# Patient Record
Sex: Female | Born: 1975 | Race: White | Hispanic: No | Marital: Single | Smoking: Never smoker
Health system: Southern US, Community
[De-identification: ages and names within clinical notes are randomized; demographics above are authoritative.]

## PROBLEM LIST (undated history)

## (undated) DIAGNOSIS — G709 Myoneural disorder, unspecified: Secondary | ICD-10-CM

## (undated) DIAGNOSIS — E079 Disorder of thyroid, unspecified: Secondary | ICD-10-CM

## (undated) DIAGNOSIS — K589 Irritable bowel syndrome without diarrhea: Secondary | ICD-10-CM

## (undated) DIAGNOSIS — F329 Major depressive disorder, single episode, unspecified: Secondary | ICD-10-CM

## (undated) DIAGNOSIS — K219 Gastro-esophageal reflux disease without esophagitis: Secondary | ICD-10-CM

## (undated) DIAGNOSIS — F419 Anxiety disorder, unspecified: Secondary | ICD-10-CM

## (undated) DIAGNOSIS — F32A Depression, unspecified: Secondary | ICD-10-CM

## (undated) DIAGNOSIS — E039 Hypothyroidism, unspecified: Secondary | ICD-10-CM

## (undated) DIAGNOSIS — M1990 Unspecified osteoarthritis, unspecified site: Secondary | ICD-10-CM

## (undated) HISTORY — DX: Major depressive disorder, single episode, unspecified: F32.9

## (undated) HISTORY — PX: COLONOSCOPY: SHX174

## (undated) HISTORY — DX: Gastro-esophageal reflux disease without esophagitis: K21.9

## (undated) HISTORY — DX: Depression, unspecified: F32.A

## (undated) HISTORY — PX: OTHER SURGICAL HISTORY: SHX169

## (undated) HISTORY — PX: WISDOM TOOTH EXTRACTION: SHX21

## (undated) HISTORY — PX: CHOLECYSTECTOMY: SHX55

## (undated) HISTORY — DX: Anxiety disorder, unspecified: F41.9

---

## 1998-01-10 ENCOUNTER — Inpatient Hospital Stay (HOSPITAL_COMMUNITY): Admission: AD | Admit: 1998-01-10 | Discharge: 1998-01-10 | Payer: Self-pay | Admitting: *Deleted

## 1998-01-25 ENCOUNTER — Other Ambulatory Visit: Admission: RE | Admit: 1998-01-25 | Discharge: 1998-01-25 | Payer: Self-pay | Admitting: Obstetrics and Gynecology

## 1998-02-14 ENCOUNTER — Inpatient Hospital Stay (HOSPITAL_COMMUNITY): Admission: AD | Admit: 1998-02-14 | Discharge: 1998-02-14 | Payer: Self-pay | Admitting: Obstetrics and Gynecology

## 1998-02-22 ENCOUNTER — Inpatient Hospital Stay (HOSPITAL_COMMUNITY): Admission: AD | Admit: 1998-02-22 | Discharge: 1998-02-24 | Payer: Self-pay | Admitting: Obstetrics and Gynecology

## 1998-02-27 ENCOUNTER — Encounter (HOSPITAL_COMMUNITY): Admission: RE | Admit: 1998-02-27 | Discharge: 1998-05-28 | Payer: Self-pay | Admitting: *Deleted

## 1998-03-27 ENCOUNTER — Emergency Department (HOSPITAL_COMMUNITY): Admission: EM | Admit: 1998-03-27 | Discharge: 1998-03-27 | Payer: Self-pay | Admitting: Emergency Medicine

## 1998-03-27 ENCOUNTER — Ambulatory Visit (HOSPITAL_COMMUNITY): Admission: RE | Admit: 1998-03-27 | Discharge: 1998-03-27 | Payer: Self-pay | Admitting: Emergency Medicine

## 1998-03-30 ENCOUNTER — Other Ambulatory Visit: Admission: RE | Admit: 1998-03-30 | Discharge: 1998-03-30 | Payer: Self-pay | Admitting: Obstetrics and Gynecology

## 1998-04-01 ENCOUNTER — Inpatient Hospital Stay (HOSPITAL_COMMUNITY): Admission: EM | Admit: 1998-04-01 | Discharge: 1998-04-02 | Payer: Self-pay | Admitting: Emergency Medicine

## 1998-05-31 ENCOUNTER — Encounter (HOSPITAL_COMMUNITY): Admission: RE | Admit: 1998-05-31 | Discharge: 1998-08-29 | Payer: Self-pay | Admitting: *Deleted

## 1998-09-01 ENCOUNTER — Encounter (HOSPITAL_COMMUNITY): Admission: RE | Admit: 1998-09-01 | Discharge: 1998-11-30 | Payer: Self-pay | Admitting: *Deleted

## 1999-03-10 ENCOUNTER — Ambulatory Visit (HOSPITAL_COMMUNITY): Admission: RE | Admit: 1999-03-10 | Discharge: 1999-03-10 | Payer: Self-pay | Admitting: Gastroenterology

## 1999-07-12 ENCOUNTER — Emergency Department (HOSPITAL_COMMUNITY): Admission: EM | Admit: 1999-07-12 | Discharge: 1999-07-12 | Payer: Self-pay | Admitting: *Deleted

## 2000-02-06 ENCOUNTER — Other Ambulatory Visit: Admission: RE | Admit: 2000-02-06 | Discharge: 2000-02-06 | Payer: Self-pay | Admitting: Obstetrics and Gynecology

## 2000-08-24 ENCOUNTER — Inpatient Hospital Stay (HOSPITAL_COMMUNITY): Admission: AD | Admit: 2000-08-24 | Discharge: 2000-08-24 | Payer: Self-pay | Admitting: Obstetrics and Gynecology

## 2000-09-08 ENCOUNTER — Inpatient Hospital Stay (HOSPITAL_COMMUNITY): Admission: AD | Admit: 2000-09-08 | Discharge: 2000-09-08 | Payer: Self-pay | Admitting: *Deleted

## 2000-09-18 ENCOUNTER — Inpatient Hospital Stay (HOSPITAL_COMMUNITY): Admission: AD | Admit: 2000-09-18 | Discharge: 2000-09-20 | Payer: Self-pay | Admitting: Obstetrics and Gynecology

## 2000-09-21 ENCOUNTER — Encounter: Admission: RE | Admit: 2000-09-21 | Discharge: 2000-11-23 | Payer: Self-pay | Admitting: Obstetrics and Gynecology

## 2000-09-28 ENCOUNTER — Inpatient Hospital Stay (HOSPITAL_COMMUNITY): Admission: AD | Admit: 2000-09-28 | Discharge: 2000-09-28 | Payer: Self-pay | Admitting: *Deleted

## 2000-10-29 ENCOUNTER — Other Ambulatory Visit: Admission: RE | Admit: 2000-10-29 | Discharge: 2000-10-29 | Payer: Self-pay | Admitting: Obstetrics and Gynecology

## 2000-12-27 ENCOUNTER — Other Ambulatory Visit: Admission: RE | Admit: 2000-12-27 | Discharge: 2000-12-27 | Payer: Self-pay | Admitting: Obstetrics and Gynecology

## 2001-10-31 ENCOUNTER — Encounter: Payer: Self-pay | Admitting: Emergency Medicine

## 2001-10-31 ENCOUNTER — Emergency Department (HOSPITAL_COMMUNITY): Admission: EM | Admit: 2001-10-31 | Discharge: 2001-10-31 | Payer: Self-pay | Admitting: Emergency Medicine

## 2001-11-06 ENCOUNTER — Other Ambulatory Visit: Admission: RE | Admit: 2001-11-06 | Discharge: 2001-11-06 | Payer: Self-pay | Admitting: Obstetrics and Gynecology

## 2001-12-21 ENCOUNTER — Observation Stay (HOSPITAL_COMMUNITY): Admission: AD | Admit: 2001-12-21 | Discharge: 2001-12-22 | Payer: Self-pay | Admitting: Obstetrics & Gynecology

## 2002-06-04 ENCOUNTER — Inpatient Hospital Stay (HOSPITAL_COMMUNITY): Admission: AD | Admit: 2002-06-04 | Discharge: 2002-06-06 | Payer: Self-pay | Admitting: Obstetrics and Gynecology

## 2002-07-11 ENCOUNTER — Other Ambulatory Visit: Admission: RE | Admit: 2002-07-11 | Discharge: 2002-07-11 | Payer: Self-pay | Admitting: Obstetrics and Gynecology

## 2002-11-04 ENCOUNTER — Encounter: Admission: RE | Admit: 2002-11-04 | Discharge: 2002-11-04 | Payer: Self-pay | Admitting: Obstetrics and Gynecology

## 2002-11-04 ENCOUNTER — Encounter: Payer: Self-pay | Admitting: Obstetrics and Gynecology

## 2002-11-27 ENCOUNTER — Other Ambulatory Visit: Admission: RE | Admit: 2002-11-27 | Discharge: 2002-11-27 | Payer: Self-pay | Admitting: Obstetrics and Gynecology

## 2003-08-08 ENCOUNTER — Emergency Department (HOSPITAL_COMMUNITY): Admission: EM | Admit: 2003-08-08 | Discharge: 2003-08-09 | Payer: Self-pay | Admitting: Emergency Medicine

## 2004-09-12 ENCOUNTER — Emergency Department (HOSPITAL_COMMUNITY): Admission: EM | Admit: 2004-09-12 | Discharge: 2004-09-12 | Payer: Self-pay | Admitting: Emergency Medicine

## 2005-04-16 ENCOUNTER — Emergency Department (HOSPITAL_COMMUNITY): Admission: EM | Admit: 2005-04-16 | Discharge: 2005-04-16 | Payer: Self-pay | Admitting: Emergency Medicine

## 2010-11-05 ENCOUNTER — Encounter: Payer: Self-pay | Admitting: Oncology

## 2011-04-27 ENCOUNTER — Other Ambulatory Visit: Payer: Self-pay | Admitting: Radiology

## 2012-08-12 ENCOUNTER — Emergency Department (HOSPITAL_COMMUNITY)
Admission: EM | Admit: 2012-08-12 | Discharge: 2012-08-12 | Disposition: A | Discharge status: Home / Self Care | Payer: BC Managed Care – PPO | Attending: Emergency Medicine | Admitting: Emergency Medicine

## 2012-08-12 ENCOUNTER — Emergency Department (HOSPITAL_COMMUNITY): Payer: BC Managed Care – PPO

## 2012-08-12 ENCOUNTER — Encounter (HOSPITAL_COMMUNITY): Payer: Self-pay | Admitting: *Deleted

## 2012-08-12 DIAGNOSIS — R1010 Upper abdominal pain, unspecified: Secondary | ICD-10-CM

## 2012-08-12 DIAGNOSIS — K589 Irritable bowel syndrome without diarrhea: Secondary | ICD-10-CM | POA: Insufficient documentation

## 2012-08-12 DIAGNOSIS — Z79899 Other long term (current) drug therapy: Secondary | ICD-10-CM | POA: Insufficient documentation

## 2012-08-12 DIAGNOSIS — R1012 Left upper quadrant pain: Secondary | ICD-10-CM | POA: Insufficient documentation

## 2012-08-12 DIAGNOSIS — R7309 Other abnormal glucose: Secondary | ICD-10-CM | POA: Insufficient documentation

## 2012-08-12 DIAGNOSIS — R739 Hyperglycemia, unspecified: Secondary | ICD-10-CM

## 2012-08-12 DIAGNOSIS — E079 Disorder of thyroid, unspecified: Secondary | ICD-10-CM | POA: Insufficient documentation

## 2012-08-12 HISTORY — DX: Disorder of thyroid, unspecified: E07.9

## 2012-08-12 HISTORY — DX: Irritable bowel syndrome, unspecified: K58.9

## 2012-08-12 LAB — URINALYSIS, ROUTINE W REFLEX MICROSCOPIC
Bilirubin Urine: NEGATIVE
Glucose, UA: NEGATIVE mg/dL
Ketones, ur: NEGATIVE mg/dL
Specific Gravity, Urine: 1.003 — ABNORMAL LOW (ref 1.005–1.030)
pH: 7 (ref 5.0–8.0)

## 2012-08-12 LAB — COMPREHENSIVE METABOLIC PANEL
Albumin: 3.7 g/dL (ref 3.5–5.2)
Alkaline Phosphatase: 79 U/L (ref 39–117)
BUN: 6 mg/dL (ref 6–23)
CO2: 27 mEq/L (ref 19–32)
Chloride: 102 mEq/L (ref 96–112)
Creatinine, Ser: 0.77 mg/dL (ref 0.50–1.10)
GFR calc Af Amer: >90 mL/min (ref >90)
GFR calc non Af Amer: >90 mL/min (ref >90)
Glucose, Bld: 141 mg/dL — ABNORMAL HIGH (ref 70–99)
Potassium: 3.8 mEq/L (ref 3.5–5.1)
Total Bilirubin: 0.2 mg/dL — ABNORMAL LOW (ref 0.3–1.2)

## 2012-08-12 LAB — CBC WITH DIFFERENTIAL/PLATELET
Basophils Relative: 1 % (ref 0–1)
HCT: 38.1 % (ref 36.0–46.0)
Hemoglobin: 12.4 g/dL (ref 12.0–15.0)
Lymphs Abs: 2 10*3/uL (ref 0.7–4.0)
MCHC: 32.5 g/dL (ref 30.0–36.0)
Monocytes Absolute: 0.5 10*3/uL (ref 0.1–1.0)
Monocytes Relative: 9 % (ref 3–12)
Neutro Abs: 3.4 10*3/uL (ref 1.7–7.7)
RBC: 4.59 MIL/uL (ref 3.87–5.11)

## 2012-08-12 LAB — POCT PREGNANCY, URINE: Preg Test, Ur: NEGATIVE

## 2012-08-12 LAB — LIPASE, BLOOD: Lipase: 31 U/L (ref 11–59)

## 2012-08-12 MED ORDER — ONDANSETRON HCL 4 MG/2ML IJ SOLN
4.0000 mg | Freq: Once | INTRAMUSCULAR | Status: AC
  Administered 2012-08-12: 4 mg via INTRAVENOUS
  Filled 2012-08-12: qty 2

## 2012-08-12 MED ORDER — PANTOPRAZOLE SODIUM 40 MG PO TBEC: 40.0000 mg | DELAYED_RELEASE_TABLET | Freq: Every day | ORAL | Status: DC

## 2012-08-12 MED ORDER — FENTANYL CITRATE 0.05 MG/ML IJ SOLN
50.0000 ug | Freq: Once | INTRAMUSCULAR | Status: AC
  Administered 2012-08-12: 50 ug via INTRAVENOUS
  Filled 2012-08-12: qty 2

## 2012-08-12 MED ORDER — SODIUM CHLORIDE 0.9 % IV BOLUS (SEPSIS)
1000.0000 mL | Freq: Once | INTRAVENOUS | Status: AC
  Administered 2012-08-12: 1000 mL via INTRAVENOUS

## 2012-08-12 NOTE — ED Provider Notes (Signed)
6:35 PM Patient to move to CDU holding for abdominal US.  Sign out received from Marlon Pel, PA-C.  Patient with upper abdominal pain after eating x several weeks.  Labs are normal.  [place] pending.    7:20 PM Patient reports continued dull pain in LUQ.  On exam, pt is A&O, NAD, abd soft, nondistended, TTP LUQ, no guarding, no rebound.  [place] pending.  Pt has surgical hx of cholecystectomy.  Has seen Dr Loreta Ave (GI)  in the past.    8:37 PM Discussed results with patient.  [place] is negative.  Pt to be d/c home with GI follow up.  Pt also advised she will need follow up for hyperglycemia.  Will give PPI trial for home.  Pt verbalizes understanding and agrees with plan.  Pt given return precautions.    Results for orders placed during the hospital encounter of 08/12/12  URINALYSIS, ROUTINE W REFLEX MICROSCOPIC      Component Value Range   Color, Urine YELLOW  YELLOW   APPearance CLEAR  CLEAR   Specific Gravity, Urine 1.003 (*) 1.005 - 1.030   pH 7.0  5.0 - 8.0   Glucose, UA NEGATIVE  NEGATIVE mg/dL   Hgb urine dipstick NEGATIVE  NEGATIVE   Bilirubin Urine NEGATIVE  NEGATIVE   Ketones, ur NEGATIVE  NEGATIVE mg/dL   Protein, ur NEGATIVE  NEGATIVE mg/dL   Urobilinogen, UA 0.2  0.0 - 1.0 mg/dL   Nitrite NEGATIVE  NEGATIVE   Leukocytes, UA TRACE (*) NEGATIVE  CBC WITH DIFFERENTIAL      Component Value Range   WBC 6.0  4.0 - 10.5 K/uL   RBC 4.59  3.87 - 5.11 MIL/uL   Hemoglobin 12.4  12.0 - 15.0 g/dL   HCT 45.4  09.8 - 11.9 %   MCV 83.0  78.0 - 100.0 fL   MCH 27.0  26.0 - 34.0 pg   MCHC 32.5  30.0 - 36.0 g/dL   RDW 14.7  82.9 - 56.2 %   Platelets 146 (*) 150 - 400 K/uL   Neutrophils Relative 57  43 - 77 %   Neutro Abs 3.4  1.7 - 7.7 K/uL   Lymphocytes Relative 33  12 - 46 %   Lymphs Abs 2.0  0.7 - 4.0 K/uL   Monocytes Relative 9  3 - 12 %   Monocytes Absolute 0.5  0.1 - 1.0 K/uL   Eosinophils Relative 1  0 - 5 %   Eosinophils Absolute 0.1  0.0 - 0.7 K/uL   Basophils Relative 1  0 - 1 %   Basophils Absolute 0.0  0.0 - 0.1 K/uL  COMPREHENSIVE METABOLIC PANEL      Component Value Range   Sodium 137  135 - 145 mEq/L   Potassium 3.8  3.5 - 5.1 mEq/L   Chloride 102  96 - 112 mEq/L   CO2 27  19 - 32 mEq/L   Glucose, Bld 141 (*) 70 - 99 mg/dL   BUN 6  6 - 23 mg/dL   Creatinine, Ser 1.30  0.50 - 1.10 mg/dL   Calcium 9.0  8.4 - 86.5 mg/dL   Total Protein 7.3  6.0 - 8.3 g/dL   Albumin 3.7  3.5 - 5.2 g/dL   AST 16  0 - 37 U/L   ALT 10  0 - 35 U/L   Alkaline Phosphatase 79  39 - 117 U/L   Total Bilirubin 0.2 (*) 0.3 - 1.2 mg/dL   GFR calc non  Af Amer >90  >90 mL/min   GFR calc Af Amer >90  >90 mL/min  LIPASE, BLOOD      Component Value Range   Lipase 31  11 - 59 U/L  POCT PREGNANCY, URINE      Component Value Range   Preg Test, Ur NEGATIVE  NEGATIVE  URINE MICROSCOPIC-ADD ON      Component Value Range   Squamous Epithelial / LPF MANY (*) RARE   WBC, UA 0-2  <3 WBC/hpf   US Abdomen Complete  08/12/2012  *RADIOLOGY REPORT*  Clinical Data:  Left upper quadrant pain.  COMPLETE ABDOMINAL ULTRASOUND  Comparison:  None.  Findings:  Gallbladder:  Surgically absent.  Common bile duct:  Normal in caliber. No biliary ductal dilation. The maximal diameter is 5.0 cm, within normal limits.  Liver:  No focal lesion identified.  Within normal limits in parenchymal echogenicity.  IVC:  Appears normal.  Pancreas:  No focal abnormality seen.  Spleen:  Normal size and echotexture without focal parenchymal abnormality. The maximal length is 8.0 cm, within normal limits.  Right Kidney:  No hydronephrosis.  Well-preserved cortex.  Normal size and parenchymal echotexture without focal abnormalities. The maximal length is 10.7, within normal limits.  Left Kidney:  No hydronephrosis.  Well-preserved cortex.  Normal size and parenchymal echotexture without focal abnormalities. The maximal length is 11.1, within normal limits.  Abdominal aorta:  No aneurysm identified.  IMPRESSION:  1.  Status post  cholecystectomy. 2.  Otherwise negative abdomen.   Original Report Authenticated By: Jamesetta Orleans. MATTERN, M.D.       [place], [place] 08/12/12 2044

## 2012-08-12 NOTE — ED Notes (Signed)
Patient IV removed from Left AC,bleeding controlled applied 2x2 gauze with paper tape.

## 2012-08-12 NOTE — ED Provider Notes (Signed)
History     CSN: [id number]  Arrival date & time 08/12/12  1451   First MD Initiated Contact with Patient 08/12/12 1747      Chief Complaint  Patient presents with  . Abdominal Pain    (Consider location/radiation/quality/duration/timing/severity/associated sxs/prior treatment) HPI  Pt presents to the ED with LUQ abdominal pain with two weeks of LUQ abdominal pain that is associated with eating. The pain is completely resolved if she does not eat and is exacerbated by food. She has never had this happen before The episode today was proceeded by her eating a cup cake. She denies any vomiting or diarrhea. She denies dysuria, chills or fevers. nad vss  Past Medical History  Diagnosis Date  . Irritable bowel syndrome (IBS)   . Thyroid disease     Past Surgical History  Procedure Date  . Cholecystectomy     No family history on file.  History  Substance Use Topics  . Smoking status: Never Smoker   . Smokeless tobacco: Not on file  . Alcohol Use: No    OB History    Grav Para Term Preterm Abortions TAB SAB Ect Mult Living                  Review of Systems   Review of Systems  Gen: no weight loss, fevers, chills, night sweats  Eyes: no discharge or drainage, no occular pain or visual changes  Nose: no epistaxis or rhinorrhea  Mouth: no dental pain, no sore throat  Neck: no neck pain  Lungs:No wheezing, coughing or hemoptysis CV: no chest pain, palpitations, dependent edema or orthopnea  Abd: + LUQ abdominal pain, no nausea, vomiting  GU: no dysuria or gross hematuria  MSK:  No abnormalities  Neuro: no headache, no focal neurologic deficits  Skin: no abnormalities Psyche: negative.    Allergies  Levaquin  Home Medications   Current Outpatient Rx  Name Route Sig Dispense Refill  . ACETAMINOPHEN 500 MG PO TABS Oral Take 1,000 mg by mouth every 6 (six) hours as needed. For pain    . ASPIRIN 325 MG PO TABS Oral Take 650 mg by mouth once as needed. For  pain    . LEVOTHYROXINE SODIUM 150 MCG PO TABS Oral Take 150 mcg by mouth daily.    . SERTRALINE HCL 50 MG PO TABS Oral Take 50 mg by mouth daily.      BP 122/84  Pulse 116  Temp 98.1 F (36.7 C) (Oral)  Resp 22  SpO2 100%  LMP 08/02/2012  Physical Exam  Nursing note and vitals reviewed. Constitutional: She appears well-developed and well-nourished. No distress.  HENT:  Head: Normocephalic and atraumatic.  Eyes: Pupils are equal, round, and reactive to light.  Neck: Normal range of motion. Neck supple.  Cardiovascular: Normal rate and regular rhythm.   Pulmonary/Chest: Effort normal.  Abdominal: Soft. There is tenderness in the left upper quadrant. There is negative Murphy's sign.  Neurological: She is alert.  Skin: Skin is warm and dry.    ED Course  Procedures (including critical care time)  Labs Reviewed  URINALYSIS, ROUTINE W REFLEX MICROSCOPIC - Abnormal; Notable for the following:    Specific Gravity, Urine 1.003 (*)     Leukocytes, UA TRACE (*)     All other components within normal limits  CBC WITH DIFFERENTIAL - Abnormal; Notable for the following:    Platelets 146 (*)     All other components within normal limits  COMPREHENSIVE METABOLIC  PANEL - Abnormal; Notable for the following:    Glucose, Bld 141 (*)     Total Bilirubin 0.2 (*)     All other components within normal limits  URINE MICROSCOPIC-ADD ON - Abnormal; Notable for the following:    Squamous Epithelial / LPF MANY (*)     All other components within normal limits  LIPASE, BLOOD  POCT PREGNANCY, URINE   No results found.   No diagnosis found.    MDM  Pts labs are all grossly normal. She is not having significant pain at the time. I have added on a Ultrasound of the abdomen to evaluate. Will move to CDU for holding. I have discussed this with Mickie Hillier. If [place] normal, pt to be given GI outpatient follow-up.        Dorthula Matas, PA 08/12/12 1839

## 2012-08-12 NOTE — ED Notes (Signed)
PT states that when she urinates or has BM she sees some pus looking stuff in the toilet

## 2012-08-12 NOTE — ED Notes (Signed)
Pt. States she frequently uses enemas at home.

## 2012-08-12 NOTE — ED Provider Notes (Signed)
Medical screening examination/treatment/procedure(s) were performed by non-physician practitioner and as supervising physician I was immediately available for consultation/collaboration.  Cheri Guppy, MD 08/12/12 2330

## 2012-08-12 NOTE — ED Provider Notes (Signed)
Medical screening examination/treatment/procedure(s) were performed by non-physician practitioner and as supervising physician I was immediately available for consultation/collaboration.  Raima Geathers, MD 08/12/12 2331 

## 2012-08-12 NOTE — ED Notes (Signed)
Pt has been having off and on LUQ abdominal pain and pain is more after eating.  Pt states s/s have been off and on for last 2 weeks.  No vomiting.  Pt reports constipation and uses enemas.

## 2012-08-15 ENCOUNTER — Other Ambulatory Visit: Payer: Self-pay | Admitting: Gastroenterology

## 2012-08-15 DIAGNOSIS — R109 Unspecified abdominal pain: Secondary | ICD-10-CM

## 2012-08-15 DIAGNOSIS — K5900 Constipation, unspecified: Secondary | ICD-10-CM

## 2012-08-19 ENCOUNTER — Ambulatory Visit
Admission: RE | Admit: 2012-08-19 | Discharge: 2012-08-19 | Disposition: A | Discharge status: Home / Self Care | Payer: BC Managed Care – PPO | Source: Ambulatory Visit | Attending: Gastroenterology | Admitting: Gastroenterology

## 2012-08-19 DIAGNOSIS — R109 Unspecified abdominal pain: Secondary | ICD-10-CM

## 2012-08-19 DIAGNOSIS — K5900 Constipation, unspecified: Secondary | ICD-10-CM

## 2015-07-04 ENCOUNTER — Encounter (HOSPITAL_COMMUNITY): Payer: Self-pay | Admitting: Emergency Medicine

## 2015-07-04 ENCOUNTER — Emergency Department (HOSPITAL_COMMUNITY)
Admission: EM | Admit: 2015-07-04 | Discharge: 2015-07-05 | Disposition: A | Discharge status: Home / Self Care | Payer: BC Managed Care – PPO | Attending: Emergency Medicine | Admitting: Emergency Medicine

## 2015-07-04 DIAGNOSIS — Z79899 Other long term (current) drug therapy: Secondary | ICD-10-CM | POA: Diagnosis not present

## 2015-07-04 DIAGNOSIS — Z3202 Encounter for pregnancy test, result negative: Secondary | ICD-10-CM | POA: Insufficient documentation

## 2015-07-04 DIAGNOSIS — E079 Disorder of thyroid, unspecified: Secondary | ICD-10-CM | POA: Diagnosis not present

## 2015-07-04 DIAGNOSIS — K5900 Constipation, unspecified: Secondary | ICD-10-CM | POA: Diagnosis not present

## 2015-07-04 DIAGNOSIS — R1012 Left upper quadrant pain: Secondary | ICD-10-CM

## 2015-07-04 LAB — COMPREHENSIVE METABOLIC PANEL
ALBUMIN: 3.8 g/dL (ref 3.5–5.0)
ALK PHOS: 77 U/L (ref 38–126)
ALT: 13 U/L — AB (ref 14–54)
AST: 20 U/L (ref 15–41)
Anion gap: 8 (ref 5–15)
BILIRUBIN TOTAL: 0.6 mg/dL (ref 0.3–1.2)
BUN: 6 mg/dL (ref 6–20)
CO2: 28 mmol/L (ref 22–32)
CREATININE: 0.77 mg/dL (ref 0.44–1.00)
Calcium: 9 mg/dL (ref 8.9–10.3)
Chloride: 100 mmol/L — ABNORMAL LOW (ref 101–111)
GFR calc Af Amer: >60 mL/min (ref >60)
GFR calc non Af Amer: >60 mL/min (ref >60)
GLUCOSE: 120 mg/dL — AB (ref 65–99)
POTASSIUM: 3.6 mmol/L (ref 3.5–5.1)
Sodium: 136 mmol/L (ref 135–145)
TOTAL PROTEIN: 7.3 g/dL (ref 6.5–8.1)

## 2015-07-04 LAB — CBC
HEMATOCRIT: 38.4 % (ref 36.0–46.0)
Hemoglobin: 13 g/dL (ref 12.0–15.0)
MCH: 30.7 pg (ref 26.0–34.0)
MCHC: 33.9 g/dL (ref 30.0–36.0)
MCV: 90.6 fL (ref 78.0–100.0)
PLATELETS: 157 10*3/uL (ref 150–400)
RBC: 4.24 MIL/uL (ref 3.87–5.11)
RDW: 12.4 % (ref 11.5–15.5)
WBC: 7.7 10*3/uL (ref 4.0–10.5)

## 2015-07-04 LAB — I-STAT BETA HCG BLOOD, ED (MC, WL, AP ONLY): I-stat hCG, quantitative: <5 m[IU]/mL (ref <5)

## 2015-07-04 LAB — LIPASE, BLOOD: Lipase: 26 U/L (ref 22–51)

## 2015-07-04 MED ORDER — IOHEXOL 300 MG/ML  SOLN
25.0000 mL | Freq: Once | INTRAMUSCULAR | Status: AC | PRN
  Administered 2015-07-04: 25 mL via ORAL

## 2015-07-04 NOTE — ED Notes (Signed)
Pt reports since Friday she has had abdominal discomfort mainly in L upper abdomen. Pt also c/o feeling bloated. Last normal bm was Thursday. Pt used enema today.

## 2015-07-04 NOTE — ED Notes (Addendum)
Pt. Given contrast to drink.

## 2015-07-04 NOTE — ED Notes (Signed)
Pt. Finished with contrast. Nurse and CT notified.

## 2015-07-04 NOTE — ED Notes (Signed)
Called Ct to let them know pt has finished drinking her contrast

## 2015-07-04 NOTE — ED Notes (Signed)
Pt ambulated to the restroom for urine sample

## 2015-07-05 ENCOUNTER — Emergency Department (HOSPITAL_COMMUNITY): Payer: BC Managed Care – PPO

## 2015-07-05 ENCOUNTER — Encounter (HOSPITAL_COMMUNITY): Payer: Self-pay

## 2015-07-05 LAB — URINALYSIS, ROUTINE W REFLEX MICROSCOPIC
Bilirubin Urine: NEGATIVE
Glucose, UA: NEGATIVE mg/dL
Hgb urine dipstick: NEGATIVE
Ketones, ur: NEGATIVE mg/dL
Leukocytes, UA: NEGATIVE
Nitrite: NEGATIVE
Protein, ur: NEGATIVE mg/dL
Specific Gravity, Urine: 1.022 (ref 1.005–1.030)
Urobilinogen, UA: 0.2 mg/dL (ref 0.0–1.0)
pH: 5 (ref 5.0–8.0)

## 2015-07-05 MED ORDER — LACTULOSE 10 GM/15ML PO SOLN: 10.0000 g | Freq: Two times a day (BID) | ORAL | Status: DC

## 2015-07-05 MED ORDER — DOCUSATE SODIUM 100 MG PO CAPS: 100.0000 mg | ORAL_CAPSULE | Freq: Two times a day (BID) | ORAL | Status: DC

## 2015-07-05 MED ORDER — SODIUM CHLORIDE 0.9 % IV BOLUS (SEPSIS)
1000.0000 mL | Freq: Once | INTRAVENOUS | Status: AC
  Administered 2015-07-05: 1000 mL via INTRAVENOUS

## 2015-07-05 MED ORDER — IOHEXOL 300 MG/ML  SOLN
100.0000 mL | Freq: Once | INTRAMUSCULAR | Status: AC | PRN
  Administered 2015-07-05: 100 mL via INTRAVENOUS

## 2015-07-05 NOTE — ED Provider Notes (Signed)
CSN: [id number]     Arrival date & time 07/04/15  2052 History   First MD Initiated Contact with Patient 07/04/15 2150     Chief Complaint  Patient presents with  . Abdominal Pain     (Consider location/radiation/quality/duration/timing/severity/associated sxs/prior Treatment) Patient is a 39 y.o. female presenting with abdominal pain.  Abdominal Pain Patient presents to the emergency department with abdominal pain that started Friday.  Patient states that her pain is mainly located in the left upper abdominal region.  She states she feels bloated.  Her last bowel movement was on Thursday.  Patient denies nausea, vomiting, fever, weakness, dizziness, headache, blurred vision, chest pain, shortness of breath, back pain, dysuria, incontinence, anorexia, or syncope.  The patient states that she did not take any medications prior to arrival.  Nothing seems to make her condition better, but palpation makes the pain worse  Past Medical History  Diagnosis Date  . Irritable bowel syndrome (IBS)   . Thyroid disease    Past Surgical History  Procedure Laterality Date  . Cholecystectomy     No family history on file. Social History  Substance Use Topics  . Smoking status: Never Smoker   . Smokeless tobacco: None  . Alcohol Use: No   OB History    No data available     Review of Systems  Gastrointestinal: Positive for abdominal pain.    All other systems negative except as documented in the HPI. All pertinent positives and negatives as reviewed in the HPI.  Allergies  Levaquin  Home Medications   Prior to Admission medications   Medication Sig Start Date End Date Taking? Authorizing Provider  acetaminophen (TYLENOL) 500 MG tablet Take 1,000 mg by mouth every 6 (six) hours as needed. For pain   Yes Historical Provider, MD  docusate sodium (COLACE) 100 MG capsule Take 100 mg by mouth daily as needed for mild constipation.    Yes Historical Provider, MD  levothyroxine (SYNTHROID,  LEVOTHROID) 150 MCG tablet Take 150 mcg by mouth daily.   Yes Historical Provider, MD  mineral oil enema Place 1 enema rectally once.   Yes Historical Provider, MD  Pseudoephedrine-APAP-DM (DAYQUIL PO) Take 1 Dose by mouth once.   Yes Historical Provider, MD  simethicone (MYLICON) 80 MG chewable tablet Chew 80 mg by mouth every 6 (six) hours as needed for flatulence.   Yes Historical Provider, MD   BP 113/72 mmHg  Pulse 83  Temp(Src) 98 F (36.7 C) (Oral)  Resp 18  Ht  (1.702 m)  Wt 207 lb 8 oz (94.121 kg)  BMI 32.49 kg/m2  SpO2 100%  LMP 06/01/2015 Physical Exam  Constitutional: She is oriented to person, place, and time. She appears well-developed and well-nourished. No distress.  HENT:  Head: Normocephalic and atraumatic.  Mouth/Throat: Oropharynx is clear and moist.  Eyes: Pupils are equal, round, and reactive to light.  Neck: Normal range of motion. Neck supple.  Cardiovascular: Normal rate, regular rhythm and normal heart sounds.  Exam reveals no gallop and no friction rub.   No murmur heard. Pulmonary/Chest: Effort normal and breath sounds normal. No respiratory distress. She has no wheezes.  Abdominal: Soft. Normal appearance and bowel sounds are normal. She exhibits no distension. There is tenderness in the left upper quadrant. There is no rebound, no guarding and no CVA tenderness. No hernia.    Neurological: She is alert and oriented to person, place, and time. She exhibits normal muscle tone. Coordination normal.  Skin: Skin  is warm and dry. No rash noted. No erythema.  Psychiatric: She has a normal mood and affect. Her behavior is normal.  Nursing note and vitals reviewed.   ED Course  Procedures (including critical care time) Labs Review Labs Reviewed  COMPREHENSIVE METABOLIC PANEL - Abnormal; Notable for the following:    Chloride 100 (*)    Glucose, Bld 120 (*)    ALT 13 (*)    All other components within normal limits  LIPASE, BLOOD  CBC  URINALYSIS,  ROUTINE W REFLEX MICROSCOPIC (NOT AT [facility])  I-STAT BETA HCG BLOOD, ED ([facility], [facility], [facility] ONLY)    Imaging Review Ct Abdomen Pelvis W Contrast  07/05/2015   CLINICAL DATA:  LEFT upper abdominal discomfort and bloating beginning 3 days ago. Used enema today. History of the irritable bowel syndrome.  EXAM: CT ABDOMEN AND PELVIS WITH CONTRAST  TECHNIQUE: Multidetector CT imaging of the abdomen and pelvis was performed using the standard protocol following bolus administration of intravenous contrast.  CONTRAST:  OMNIPAQUE IOHEXOL 300 MG/ML  SOLN  COMPARISON:  None.  FINDINGS: LUNG BASES: 3 mm RIGHT middle lobe, lateral segment pulmonary nodule, axial 3/29. Included heart size is normal, no pericardial effusions.  SOLID ORGANS: Minimal LEFT intrahepatic biliary dilatation is likely postprocedural, liver is otherwise unremarkable. Spleen, pancreas and adrenal glands are unremarkable. Status postcholecystectomy.  GASTROINTESTINAL TRACT: Small hiatal hernia. The stomach, small and large bowel are normal in course and caliber without inflammatory changes. Mild amount of retained large bowel stool. Normal appendix.  KIDNEYS/ URINARY TRACT: Kidneys are orthotopic, demonstrating symmetric enhancement. No nephrolithiasis, hydronephrosis or solid renal masses. The unopacified ureters are normal in course and caliber. Delayed imaging through the kidneys demonstrates symmetric prompt contrast excretion within the proximal urinary collecting system. Urinary bladder is partially distended and unremarkable.  PERITONEUM/RETROPERITONEUM: Aortoiliac vessels are normal in course and caliber. No lymphadenopathy by CT size criteria. Internal reproductive organs are unremarkable. Small amount of free fluid in the pelvis is likely physiologic.  SOFT TISSUE/OSSEOUS STRUCTURES: Low-density 2.9 x 1.7 cm (transverse by AP ) expansile mass centered at T10, within the LEFT aspect of the spinal canal expands and scallops the osseous canal and  the LEFT T10-11 neural foramen. Lesion appears to efface the thecal sac.  IMPRESSION: Mild amount of retained large bowel stool without bowel obstruction or acute intra-abdominal/ pelvic process.  3 mm RIGHT middle lobe pulmonary nodule. If the patient is at high risk for bronchogenic carcinoma, follow-up chest CT at 1 year is recommended. If the patient is at low risk, no follow-up is needed. This recommendation follows the consensus statement: Guidelines for Management of Small Pulmonary Nodules Detected on CT Scans: A Statement from the Fleischner Society as published in Radiology 2005; 237:395-400.  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 Recommend follow-up MRI of the thoracic spine with contrast on a nonemergent basis.   Electronically Signed   By: Awilda Metro M.D.   On: 07/05/2015 00:47   I have personally reviewed and evaluated these images and lab results as part of my medical decision-making.  Advised the patient of the results of her CT scan and laboratory testing.  The patient is advised follow-up with her primary care Dr. told to return here as needed.  Advised her this could be an evolving process, but this is not fully declare itself and she will need to return here for worsening in her  condition.  Patient is advised to increase her fluid intake and return here as needed    Charlestine Night, PA-C 07/05/15 0201  Arby Barrette, MD 07/05/15 2358

## 2015-07-05 NOTE — Discharge Instructions (Signed)
Return here as needed.  Increase your fluid intake, rest as much as possible.  Follow up with your primary care doctor °

## 2015-07-05 NOTE — ED Notes (Signed)
Pt. Returned from CT.

## 2016-06-01 ENCOUNTER — Emergency Department (HOSPITAL_COMMUNITY): Payer: BC Managed Care – PPO

## 2016-06-01 ENCOUNTER — Encounter (HOSPITAL_COMMUNITY): Payer: Self-pay

## 2016-06-01 ENCOUNTER — Emergency Department (HOSPITAL_COMMUNITY)
Admission: EM | Admit: 2016-06-01 | Discharge: 2016-06-01 | Disposition: A | Discharge status: Home / Self Care | Payer: BC Managed Care – PPO | Attending: Emergency Medicine | Admitting: Emergency Medicine

## 2016-06-01 DIAGNOSIS — R1012 Left upper quadrant pain: Secondary | ICD-10-CM | POA: Insufficient documentation

## 2016-06-01 DIAGNOSIS — Z79899 Other long term (current) drug therapy: Secondary | ICD-10-CM | POA: Diagnosis not present

## 2016-06-01 LAB — COMPREHENSIVE METABOLIC PANEL
ALK PHOS: 82 U/L (ref 38–126)
ALT: 14 U/L (ref 14–54)
AST: 17 U/L (ref 15–41)
Albumin: 4.1 g/dL (ref 3.5–5.0)
Anion gap: 7 (ref 5–15)
BUN: 12 mg/dL (ref 6–20)
CALCIUM: 9 mg/dL (ref 8.9–10.3)
CO2: 27 mmol/L (ref 22–32)
CREATININE: 0.92 mg/dL (ref 0.44–1.00)
Chloride: 104 mmol/L (ref 101–111)
Glucose, Bld: 110 mg/dL — ABNORMAL HIGH (ref 65–99)
Potassium: 3.7 mmol/L (ref 3.5–5.1)
Sodium: 138 mmol/L (ref 135–145)
Total Bilirubin: 0.6 mg/dL (ref 0.3–1.2)
Total Protein: 7.4 g/dL (ref 6.5–8.1)

## 2016-06-01 LAB — URINALYSIS, ROUTINE W REFLEX MICROSCOPIC
BILIRUBIN URINE: NEGATIVE
Glucose, UA: NEGATIVE mg/dL
KETONES UR: NEGATIVE mg/dL
NITRITE: NEGATIVE
PROTEIN: NEGATIVE mg/dL
Specific Gravity, Urine: 1.015 (ref 1.005–1.030)
pH: 5.5 (ref 5.0–8.0)

## 2016-06-01 LAB — CBC WITH DIFFERENTIAL/PLATELET
BASOS PCT: 1 %
Basophils Absolute: 0 10*3/uL (ref 0.0–0.1)
EOS ABS: 0.1 10*3/uL (ref 0.0–0.7)
EOS PCT: 2 %
HEMATOCRIT: 38.8 % (ref 36.0–46.0)
Hemoglobin: 13.3 g/dL (ref 12.0–15.0)
Lymphocytes Relative: 27 %
Lymphs Abs: 2.1 10*3/uL (ref 0.7–4.0)
MCH: 30.2 pg (ref 26.0–34.0)
MCHC: 34.3 g/dL (ref 30.0–36.0)
MCV: 88 fL (ref 78.0–100.0)
MONO ABS: 0.5 10*3/uL (ref 0.1–1.0)
MONOS PCT: 6 %
Neutro Abs: 5 10*3/uL (ref 1.7–7.7)
Neutrophils Relative %: 64 %
Platelets: 172 10*3/uL (ref 150–400)
RBC: 4.41 MIL/uL (ref 3.87–5.11)
RDW: 12.4 % (ref 11.5–15.5)
WBC: 7.8 10*3/uL (ref 4.0–10.5)

## 2016-06-01 LAB — POC URINE PREG, ED: PREG TEST UR: NEGATIVE

## 2016-06-01 LAB — URINE MICROSCOPIC-ADD ON

## 2016-06-01 LAB — I-STAT BETA HCG BLOOD, ED (MC, WL, AP ONLY)

## 2016-06-01 LAB — LIPASE, BLOOD: Lipase: 33 U/L (ref 11–51)

## 2016-06-01 MED ORDER — PROMETHAZINE HCL 25 MG PO TABS: 25.0000 mg | ORAL_TABLET | Freq: Four times a day (QID) | ORAL | 0 refills | Status: DC | PRN

## 2016-06-01 MED ORDER — TRAMADOL HCL 50 MG PO TABS: 50.0000 mg | ORAL_TABLET | Freq: Four times a day (QID) | ORAL | 0 refills | Status: DC | PRN

## 2016-06-01 MED ORDER — PANTOPRAZOLE SODIUM 20 MG PO TBEC: 20.0000 mg | DELAYED_RELEASE_TABLET | Freq: Every day | ORAL | 0 refills | Status: DC

## 2016-06-01 NOTE — ED Provider Notes (Signed)
[facility]-EMERGENCY DEPT Provider Note   CSN: [id number] Arrival date & time: 06/01/16  1718     History   Chief Complaint Chief Complaint  Patient presents with  . Abdominal Pain    HPI Anna Taylor is a 40 y.o. female.  Patient states that she's been having left upper quadrant abdominal pain worse after eating. She also has been having some reflux symptoms   The history is provided by the patient. No language interpreter was used.  Abdominal Pain   This is a new problem. The current episode started more than 1 week ago. The problem occurs daily. The problem has not changed since onset.The pain is associated with eating. The pain is located in the LUQ. The pain is at a severity of 4/10. Pertinent negatives include anorexia, diarrhea, frequency, hematuria and headaches. Nothing aggravates the symptoms. Nothing relieves the symptoms. Past workup does not include GI consult. Her past medical history does not include gallstones.    Past Medical History:  Diagnosis Date  . Irritable bowel syndrome (IBS)   . Thyroid disease     There are no active problems to display for this patient.   Past Surgical History:  Procedure Laterality Date  . CHOLECYSTECTOMY      OB History    No data available       Home Medications    Prior to Admission medications   Medication Sig Start Date End Date Taking? Authorizing Provider  levothyroxine (SYNTHROID, LEVOTHROID) 150 MCG tablet Take 150 mcg by mouth daily.   Yes Historical Provider, MD  naproxen sodium (ANAPROX) 220 MG tablet Take 440 mg by mouth 2 (two) times daily as needed (pain).   Yes Historical Provider, MD  sertraline (ZOLOFT) 50 MG tablet Take 50 mg by mouth daily.   Yes Historical Provider, MD  acetaminophen (TYLENOL) 500 MG tablet Take 1,000 mg by mouth every 6 (six) hours as needed. For pain    Historical Provider, MD  docusate sodium (COLACE) 100 MG capsule Take 1 capsule (100 mg total) by mouth every 12 (twelve)  hours. Patient not taking: Reported on 06/01/2016 07/05/15   Charlestine Nighthristopher Lawyer, PA-C  lactulose (CHRONULAC) 10 GM/15ML solution Take 15 mLs (10 g total) by mouth 2 (two) times daily. Patient not taking: Reported on 06/01/2016 07/05/15   Charlestine Nighthristopher Lawyer, PA-C  pantoprazole (PROTONIX) 20 MG tablet Take 1 tablet (20 mg total) by mouth daily. 06/01/16   Bethann BerkshireJoseph Mckinnley Cottier, MD  promethazine (PHENERGAN) 25 MG tablet Take 1 tablet (25 mg total) by mouth every 6 (six) hours as needed for nausea or vomiting. 06/01/16   Bethann BerkshireJoseph Aslin Farinas, MD  Pseudoephedrine-APAP-DM (DAYQUIL PO) Take 1 Dose by mouth once.    Historical Provider, MD  simethicone (MYLICON) 80 MG chewable tablet Chew 80 mg by mouth every 6 (six) hours as needed for flatulence.    Historical Provider, MD  traMADol (ULTRAM) 50 MG tablet Take 1 tablet (50 mg total) by mouth every 6 (six) hours as needed. 06/01/16   Bethann BerkshireJoseph Eulamae Greenstein, MD    Family History No family history on file.  Social History Social History  Substance Use Topics  . Smoking status: Never Smoker  . Smokeless tobacco: Never Used  . Alcohol use No     Allergies   Levaquin [levofloxacin in d5w]   Review of Systems Review of Systems  Constitutional: Negative for appetite change and fatigue.  HENT: Negative for congestion, ear discharge and sinus pressure.   Eyes: Negative for discharge.  Respiratory: Negative  for cough.   Cardiovascular: Negative for chest pain.  Gastrointestinal: Positive for abdominal pain. Negative for anorexia and diarrhea.  Genitourinary: Negative for frequency and hematuria.  Musculoskeletal: Negative for back pain.  Skin: Negative for rash.  Neurological: Negative for seizures and headaches.  Psychiatric/Behavioral: Negative for hallucinations.     Physical Exam Updated Vital Signs BP 118/73   Pulse 86   Temp 98 F (36.7 C)   Resp 20   Ht 5\' 6"  (1.676 m)   Wt 220 lb (99.8 kg)   LMP 05/31/2016 (Approximate)   SpO2 100%   BMI 35.51 kg/m    Physical Exam  Constitutional: She is oriented to person, place, and time. She appears well-developed.  HENT:  Head: Normocephalic.  Eyes: Conjunctivae and EOM are normal. No scleral icterus.  Neck: Neck supple. No thyromegaly present.  Cardiovascular: Normal rate and regular rhythm.  Exam reveals no gallop and no friction rub.   No murmur heard. Pulmonary/Chest: No stridor. She has no wheezes. She has no rales. She exhibits no tenderness.  Abdominal: She exhibits no distension. There is tenderness. There is no rebound.  Mild right upper quadrant tenderness  Musculoskeletal: Normal range of motion. She exhibits no edema.  Lymphadenopathy:    She has no cervical adenopathy.  Neurological: She is oriented to person, place, and time. She exhibits normal muscle tone. Coordination normal.  Skin: No rash noted. No erythema.  Psychiatric: She has a normal mood and affect. Her behavior is normal.     ED Treatments / Results  Labs (all labs ordered are listed, but only abnormal results are displayed) Labs Reviewed  COMPREHENSIVE METABOLIC PANEL - Abnormal; Notable for the following:       Result Value   Glucose, Bld 110 (*)    All other components within normal limits  URINALYSIS, ROUTINE W REFLEX MICROSCOPIC (NOT AT [facility]) - Abnormal; Notable for the following:    Hgb urine dipstick SMALL (*)    Leukocytes, UA TRACE (*)    All other components within normal limits  URINE MICROSCOPIC-ADD ON - Abnormal; Notable for the following:    Squamous Epithelial / LPF 6-30 (*)    Bacteria, UA FEW (*)    All other components within normal limits  URINE CULTURE  LIPASE, BLOOD  CBC WITH DIFFERENTIAL/PLATELET  POC URINE PREG, ED  I-STAT BETA HCG BLOOD, ED ([facility], [facility], [facility] ONLY)    EKG  EKG Interpretation None       Radiology Dg Abd Acute W/chest  Result Date: 06/01/2016 CLINICAL DATA:  Left upper quadrant abdominal pain several months. EXAM: DG ABDOMEN ACUTE W/ 1V CHEST COMPARISON:  CT of  the abdomen and pelvis 07/05/2015. FINDINGS: The heart size normal.  Lungs are clear. Surgical clips are present at the gallbladder fossa. The bowel gas pattern is normal. No obstruction or free air is present. 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 The axial skeleton is otherwise unremarkable. IMPRESSION: Negative abdominal radiographs.  No acute cardiopulmonary disease. Electronically Signed   By: Marin Robertshristopher  Mattern M.D.   On: 06/01/2016 20:25    Procedures Procedures (including critical care time)  Medications Ordered in ED Medications - No data to display   Initial Impression / Assessment and Plan / ED Course  I have reviewed the triage vital signs and the nursing notes.  Pertinent labs & imaging results that were available during my care of the patient were reviewed by me and  considered in my medical decision making (see chart for details).  Clinical Course  Labs unremarkable except for a few white cells in her urine. Suspect she does not have a UTI. We will get a urine culture. Abdominal series unremarkable. Patient had a CT of her abdomen done last year was unremarkable. She did not want another scan done. We will treat her discomfort has peptic ulcer disease with protonic Ultram and Zofran and she is to follow-up with her PCP  Final Clinical Impressions(s) / ED Diagnoses   Final diagnoses:  Left upper quadrant pain    New Prescriptions New Prescriptions   PANTOPRAZOLE (PROTONIX) 20 MG TABLET    Take 1 tablet (20 mg total) by mouth daily.   PROMETHAZINE (PHENERGAN) 25 MG TABLET    Take 1 tablet (25 mg total) by mouth every 6 (six) hours as needed for nausea or vomiting.   TRAMADOL (ULTRAM) 50 MG TABLET    Take 1 tablet (50 mg total) by mouth every 6 (six) hours as needed.     Bethann Berkshire, MD 06/01/16 2236

## 2016-06-01 NOTE — ED Triage Notes (Signed)
Pt presents with c/o left upper quadrant abdominal pain. Pt reports she has had this pain off and on for a couple of months but today the pain has become worse. Pt reports the pain is sharp in nature, has gotten progressively worse, and she has had more reflux today than normal. Pt also reports mucus in her stool when she has a bowel movement.

## 2016-06-01 NOTE — Discharge Instructions (Signed)
Follow-up with a family doctor in 1-2 weeks for recheck. They can also check your urine culture results

## 2016-06-03 LAB — URINE CULTURE

## 2016-10-17 IMAGING — CT CT ABD-PELV W/ CM
2 of 5 series · 11 of 46 positions shown, 12 images · IV contrast (Iodine)
Comparison: None.

CLINICAL DATA: LEFT upper abdominal discomfort and bloating
beginning 3 days ago. Used enema today. History of the irritable
bowel syndrome.

EXAM:
CT ABDOMEN AND PELVIS WITH CONTRAST
TECHNIQUE: Multidetector CT imaging of the abdomen and pelvis was performed
using the standard protocol following bolus administration of
intravenous contrast.
CONTRAST:  100mL OMNIPAQUE IOHEXOL 300 MG/ML  SOLN

[Series 201: routine, idose (2) · axial · 0.78mm/px · z∈[+20,+370]mm · 8 of 90 slices shown, 9 images]
[im 10/90  soft-tissue]
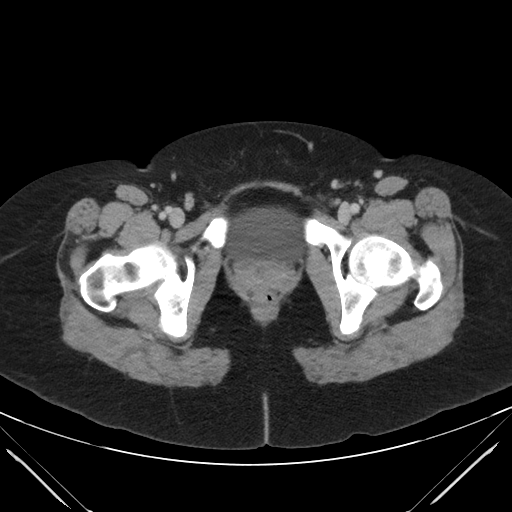
[im 10/90  bone]
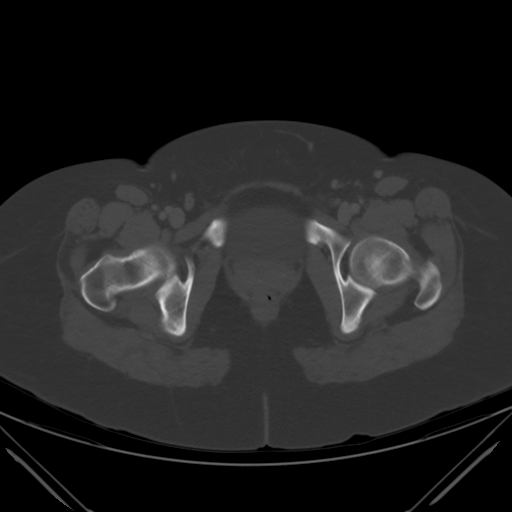
[im 20/90  soft-tissue]
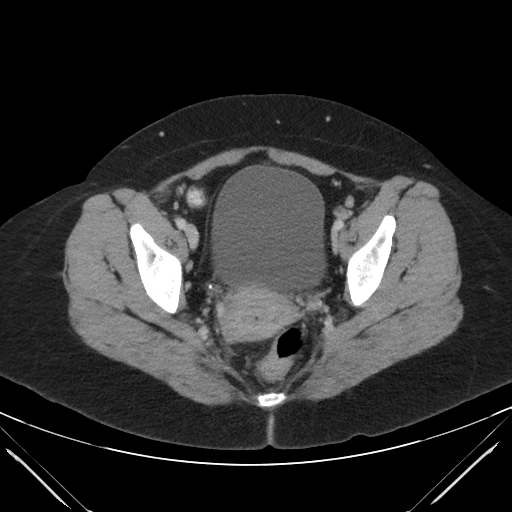
[im 30/90  soft-tissue]
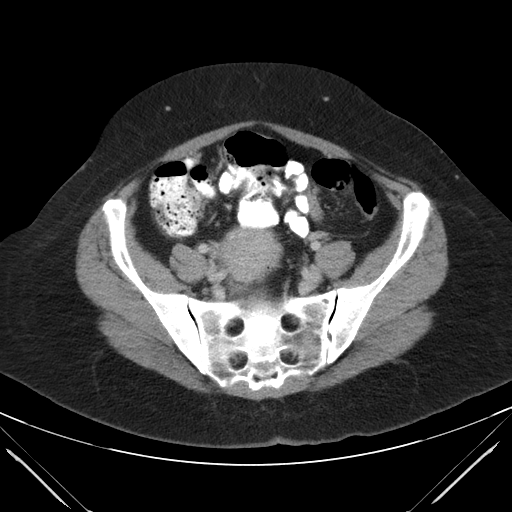
[im 40/90  soft-tissue]
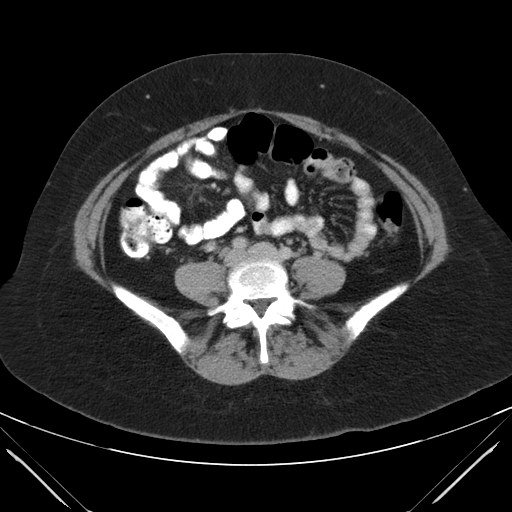
[im 50/90  soft-tissue]
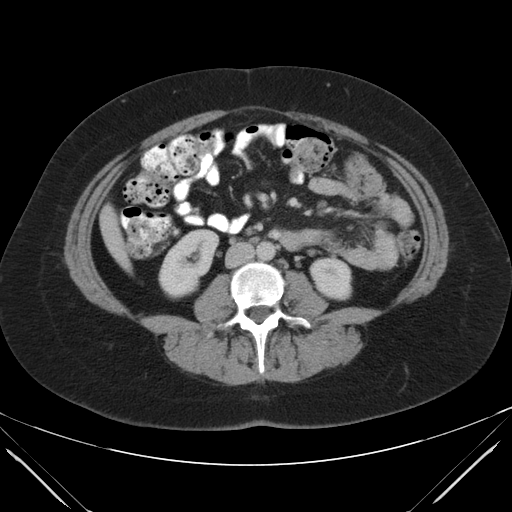
[im 60/90  soft-tissue]
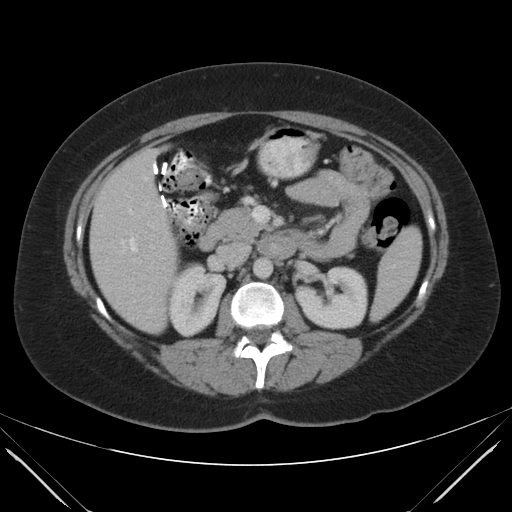
[im 70/90  soft-tissue]
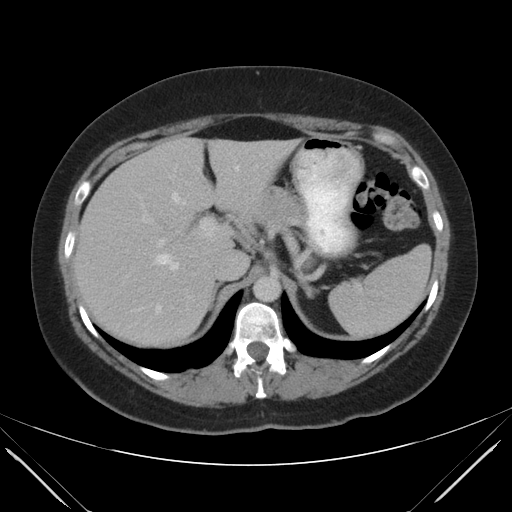
[im 80/90  soft-tissue]
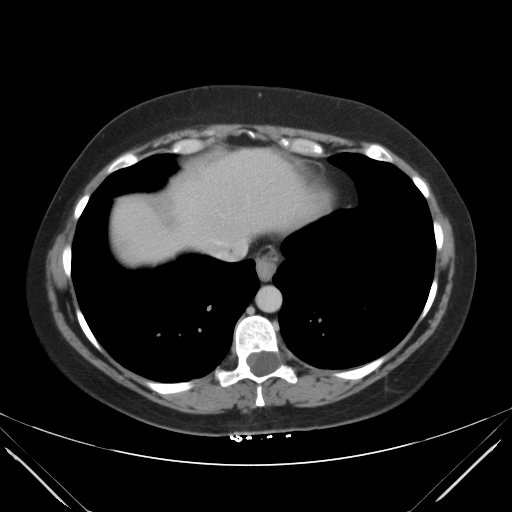

[Series 203: coronals, idose (2) · coronal · 0.45mm/px · 3 of 129 slices shown]
[im 43/129  soft-tissue]
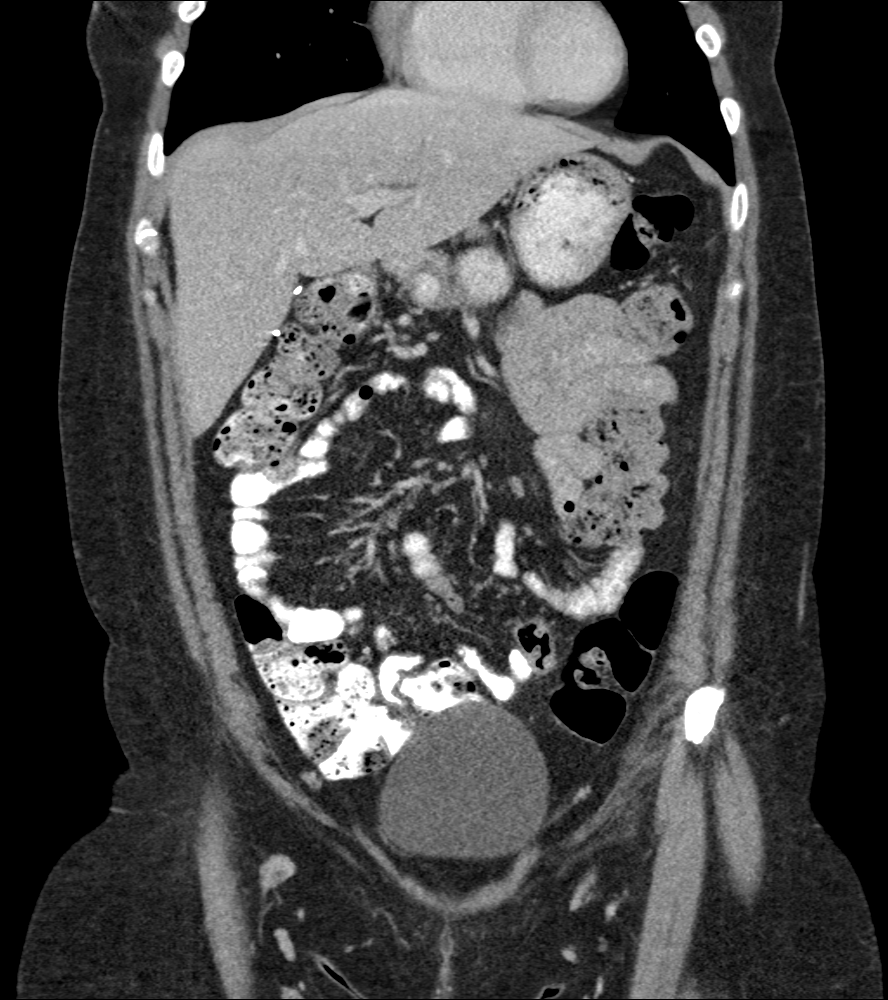
[im 57/129  soft-tissue]
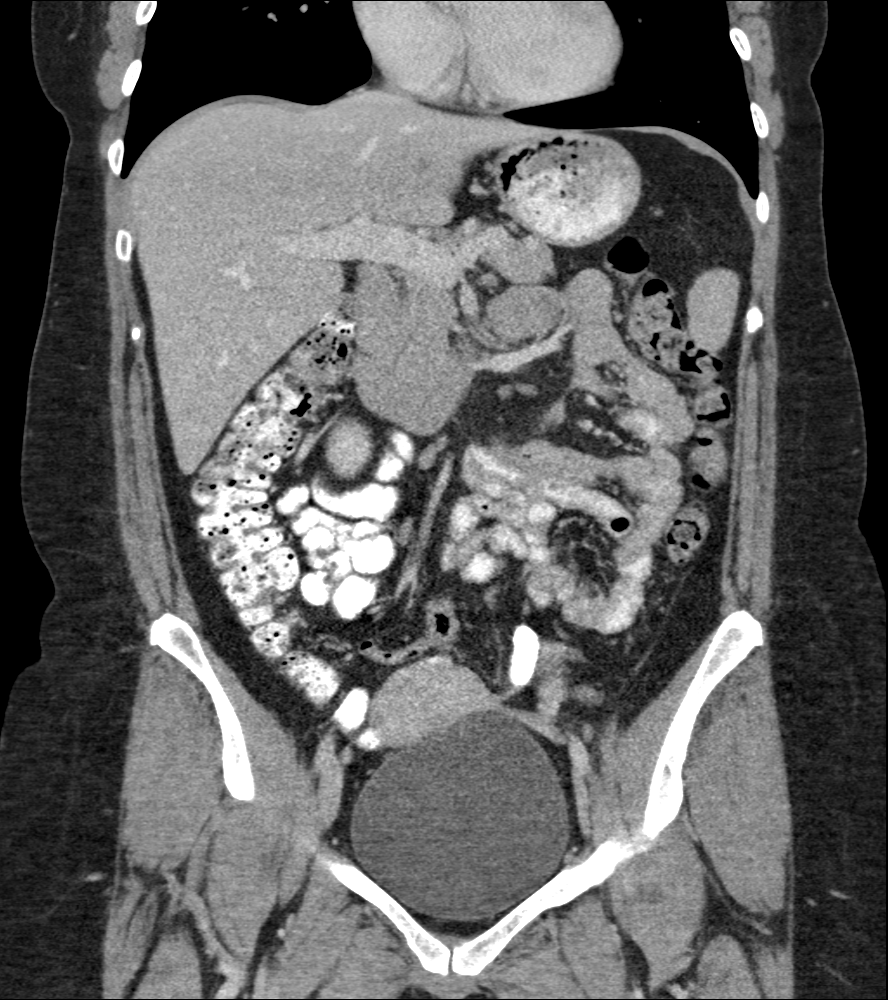
[im 72/129  soft-tissue]
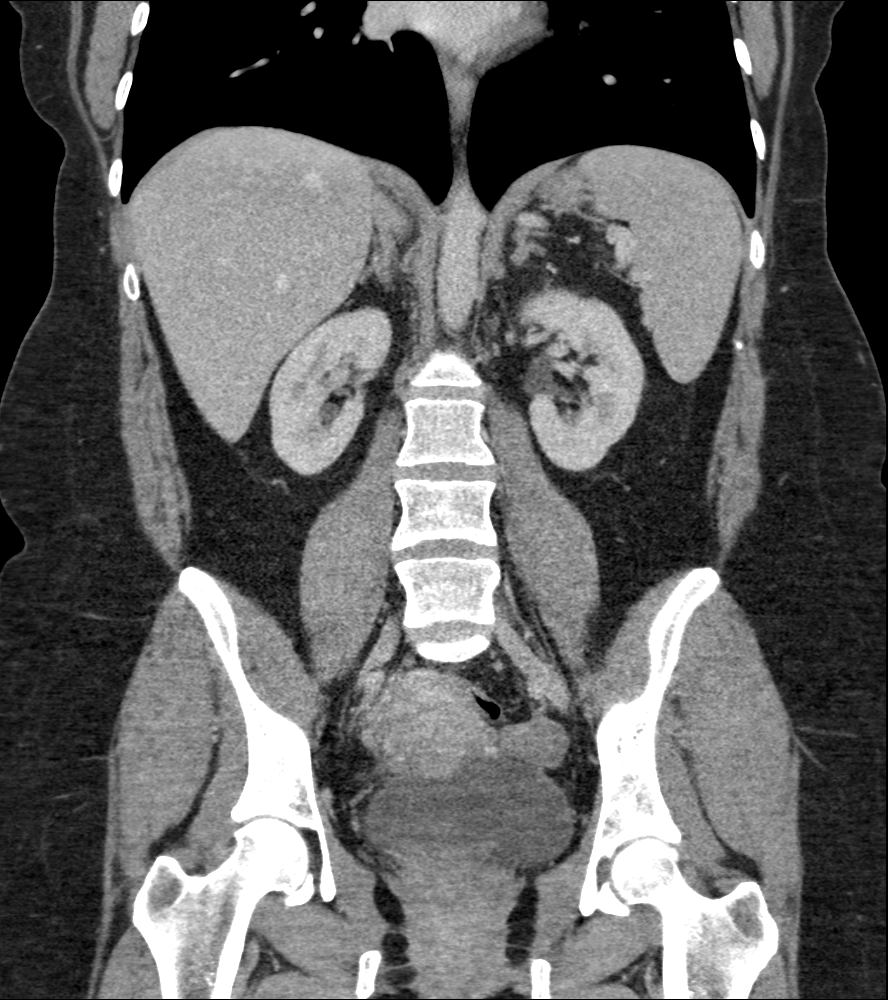

[11 of 46 positions shown; findings below may reference images not displayed]

FINDINGS: LUNG BASES: 3 mm RIGHT middle lobe, lateral segment pulmonary
nodule, axial [DATE]. Included heart size is normal, no pericardial
effusions.

SOLID ORGANS: Minimal LEFT intrahepatic biliary dilatation is likely
postprocedural, liver is otherwise unremarkable. Spleen, pancreas
and adrenal glands are unremarkable. Status postcholecystectomy.

GASTROINTESTINAL TRACT: Small hiatal hernia. The stomach, small and
large bowel are normal in course and caliber without inflammatory
changes. Mild amount of retained large bowel stool. Normal appendix.

KIDNEYS/ URINARY TRACT: Kidneys are orthotopic, demonstrating
symmetric enhancement. No nephrolithiasis, hydronephrosis or solid
renal masses. The unopacified ureters are normal in course and
caliber. Delayed imaging through the kidneys demonstrates symmetric
prompt contrast excretion within the proximal urinary collecting
system. Urinary bladder is partially distended and unremarkable.

PERITONEUM/RETROPERITONEUM: Aortoiliac vessels are normal in course
and caliber. No lymphadenopathy by CT size criteria. Internal
reproductive organs are unremarkable. Small amount of free fluid in
the pelvis is likely physiologic.

SOFT TISSUE/OSSEOUS STRUCTURES: Low-density 2.9 x 1.7 cm (transverse
by AP ) expansile mass centered at T10, within the LEFT aspect of
the spinal canal expands and scallops the osseous canal and the LEFT
T10-11 neural foramen. Lesion appears to efface the thecal sac.
IMPRESSION: Mild amount of retained large bowel stool without bowel obstruction
or acute intra-abdominal/ pelvic process.

3 mm RIGHT middle lobe pulmonary nodule. If the patient is at high
risk for bronchogenic carcinoma, follow-up chest CT at 1 year is
recommended. If the patient is at low risk, no follow-up is needed.
This recommendation follows the consensus statement: Guidelines for
Management of Small Pulmonary Nodules Detected on CT Scans: A
Statement from the [HOSPITAL] as published in Radiology
8339; [DATE].

Cystic expansile 2.9 x 1.7 cm intra canalicular spinal mass at T10,
benign in appearance (this could represent schwannoma, arachnoid
cyst). Recommend follow-up MRI of the thoracic spine with contrast
on a nonemergent basis.

## 2018-07-06 ENCOUNTER — Other Ambulatory Visit: Payer: Self-pay

## 2018-07-06 ENCOUNTER — Encounter (HOSPITAL_COMMUNITY): Payer: Self-pay | Admitting: Emergency Medicine

## 2018-07-06 ENCOUNTER — Emergency Department (HOSPITAL_COMMUNITY)
Admission: EM | Admit: 2018-07-06 | Discharge: 2018-07-06 | Disposition: A | Discharge status: Home / Self Care | Payer: BC Managed Care – PPO | Attending: Emergency Medicine | Admitting: Emergency Medicine

## 2018-07-06 DIAGNOSIS — R1013 Epigastric pain: Secondary | ICD-10-CM | POA: Diagnosis not present

## 2018-07-06 DIAGNOSIS — K2950 Unspecified chronic gastritis without bleeding: Secondary | ICD-10-CM | POA: Diagnosis not present

## 2018-07-06 DIAGNOSIS — R1012 Left upper quadrant pain: Secondary | ICD-10-CM | POA: Diagnosis present

## 2018-07-06 LAB — COMPREHENSIVE METABOLIC PANEL
ALBUMIN: 3.8 g/dL (ref 3.5–5.0)
ALK PHOS: 76 U/L (ref 38–126)
ALT: 12 U/L (ref 0–44)
ANION GAP: 10 (ref 5–15)
AST: 18 U/L (ref 15–41)
BUN: <5 mg/dL — ABNORMAL LOW (ref 6–20)
CALCIUM: 9.1 mg/dL (ref 8.9–10.3)
CHLORIDE: 104 mmol/L (ref 98–111)
CO2: 25 mmol/L (ref 22–32)
Creatinine, Ser: 0.75 mg/dL (ref 0.44–1.00)
GFR calc non Af Amer: >60 mL/min (ref >60)
GLUCOSE: 115 mg/dL — AB (ref 70–99)
Potassium: 3.8 mmol/L (ref 3.5–5.1)
SODIUM: 139 mmol/L (ref 135–145)
Total Bilirubin: 0.7 mg/dL (ref 0.3–1.2)
Total Protein: 7.2 g/dL (ref 6.5–8.1)

## 2018-07-06 LAB — LIPASE, BLOOD: LIPASE: 26 U/L (ref 11–51)

## 2018-07-06 LAB — URINALYSIS, ROUTINE W REFLEX MICROSCOPIC
Bilirubin Urine: NEGATIVE
Glucose, UA: NEGATIVE mg/dL
HGB URINE DIPSTICK: NEGATIVE
KETONES UR: NEGATIVE mg/dL
Leukocytes, UA: NEGATIVE
Nitrite: POSITIVE — AB
PROTEIN: NEGATIVE mg/dL
Specific Gravity, Urine: 1.014 (ref 1.005–1.030)
pH: 7 (ref 5.0–8.0)

## 2018-07-06 LAB — I-STAT BETA HCG BLOOD, ED (MC, WL, AP ONLY)

## 2018-07-06 LAB — CBC
HEMATOCRIT: 40.3 % (ref 36.0–46.0)
HEMOGLOBIN: 12.1 g/dL (ref 12.0–15.0)
MCH: 24.9 pg — AB (ref 26.0–34.0)
MCHC: 30 g/dL (ref 30.0–36.0)
MCV: 82.9 fL (ref 78.0–100.0)
Platelets: 181 10*3/uL (ref 150–400)
RBC: 4.86 MIL/uL (ref 3.87–5.11)
RDW: 15 % (ref 11.5–15.5)
WBC: 6 10*3/uL (ref 4.0–10.5)

## 2018-07-06 MED ORDER — SUCRALFATE 1 G PO TABS: 1.0000 g | ORAL_TABLET | Freq: Three times a day (TID) | ORAL | 2 refills | Status: DC

## 2018-07-06 MED ORDER — RANITIDINE HCL 150 MG PO TABS: 150.0000 mg | ORAL_TABLET | Freq: Two times a day (BID) | ORAL | 0 refills | Status: DC

## 2018-07-06 NOTE — Discharge Instructions (Addendum)
Your blood work was reassuring.   Your symptoms are likely inflammation of the stomach lining. This is called gastritis. Please do not take any antiinflammatory medications like ibuprofen or aleve. Continue taking pantoprazole. I have written you a prescription for Zantac, please take this twice a day 30 minutes before mealtime.  Please also take Carafate, remember that this is the medicine that you can crush up and place in 30 mL of warm water.  Follow-up with a GI specialist, as you will likely need a scope to look inside the stomach.  I have listed the information to lobe our gastroenterology below.  Call and make an appointment on Monday.  Return to the ER if you have any new or concerning symptoms like fever, vomiting, having black or bloody bowel movements.

## 2018-07-06 NOTE — ED Triage Notes (Signed)
Pt. Stated, Ive had left flank pain especially after I eat Its almost not bearable.

## 2018-07-06 NOTE — ED Provider Notes (Signed)
[facility] EMERGENCY DEPARTMENT Provider Note   CSN: [id number] Arrival date & time: 07/06/18  4098     History   Chief Complaint Chief Complaint  Patient presents with  . Flank Pain  . Abdominal Pain    HPI Anna Taylor is a 42 y.o. female.  HPI  Anna Taylor is a 42 year old female with a history of IBS and hypothyroidism who presents to the emergency department for evaluation of burning sensation in the left upper quadrant after eating.  Per chart review, she has had the symptoms in the past.  Patient reports that over the past week her symptoms have worsened.  She states that she has severe burning sensation for several hours after eating any food.  She also feels as if the left upper quadrant is more swollen.  She has tried taking 800 mg ibuprofen for her symptoms without relief.  She is on daily Protonix.  Has never seen a GI doctor for her symptoms and has never had an endoscopy procedure.  She denies pain currently as she has not eaten today.  She denies fevers, chills, nausea/vomiting, melena, hematochezia, diarrhea, constipation, dysuria, urinary frequency, hematuria, back pain, pelvic pain, vaginal discharge or vaginal bleeding chest pain, shortness of breath, lightheadedness or syncope.  She has had a prior cholecystectomy procedure, otherwise no other abdominal surgeries.  Per chart review, patient was seen 06/2015 for similar symptoms and had a CT abdomen/pelvis which was unremarkable.  Past Medical History:  Diagnosis Date  . Irritable bowel syndrome (IBS)   . Thyroid disease     There are no active problems to display for this patient.   Past Surgical History:  Procedure Laterality Date  . CHOLECYSTECTOMY       OB History   None      Home Medications    Prior to Admission medications   Medication Sig Start Date End Date Taking? Authorizing Provider  acetaminophen (TYLENOL) 500 MG tablet Take 1,000 mg by mouth every 6 (six)  hours as needed. For pain    [provider]  docusate sodium (COLACE) 100 MG capsule Take 1 capsule (100 mg total) by mouth every 12 (twelve) hours. Patient not taking: Reported on 06/01/2016 07/05/15   Charlestine Night, PA-C  lactulose (CHRONULAC) 10 GM/15ML solution Take 15 mLs (10 g total) by mouth 2 (two) times daily. Patient not taking: Reported on 06/01/2016 07/05/15   Charlestine Night, PA-C  levothyroxine (SYNTHROID, LEVOTHROID) 150 MCG tablet Take 150 mcg by mouth daily.    [provider]  naproxen sodium (ANAPROX) 220 MG tablet Take 440 mg by mouth 2 (two) times daily as needed (pain).    [provider]  pantoprazole (PROTONIX) 20 MG tablet Take 1 tablet (20 mg total) by mouth daily. 06/01/16   Bethann Berkshire, MD  promethazine (PHENERGAN) 25 MG tablet Take 1 tablet (25 mg total) by mouth every 6 (six) hours as needed for nausea or vomiting. 06/01/16   Bethann Berkshire, MD  Pseudoephedrine-APAP-DM (DAYQUIL PO) Take 1 Dose by mouth once.    [provider]  sertraline (ZOLOFT) 50 MG tablet Take 50 mg by mouth daily.    [provider]  simethicone (MYLICON) 80 MG chewable tablet Chew 80 mg by mouth every 6 (six) hours as needed for flatulence.    [provider]  traMADol (ULTRAM) 50 MG tablet Take 1 tablet (50 mg total) by mouth every 6 (six) hours as needed. 06/01/16   Bethann Berkshire, MD  Family History No family history on file.  Social History Social History   Tobacco Use  . Smoking status: Never Smoker  . Smokeless tobacco: Never Used  Substance Use Topics  . Alcohol use: No  . Drug use: No     Allergies   Levaquin [levofloxacin in d5w]   Review of Systems Review of Systems  Constitutional: Negative for chills and fever.  HENT: Negative for congestion and sore throat.   Respiratory: Negative for cough and shortness of breath.   Cardiovascular: Negative for chest pain.  Gastrointestinal: Positive for abdominal  pain. Negative for blood in stool, diarrhea, nausea and vomiting.  Genitourinary: Negative for difficulty urinating, dysuria, flank pain, hematuria, vaginal bleeding and vaginal discharge.  Musculoskeletal: Negative for back pain and gait problem.  Skin: Negative for rash.  Neurological: Negative for syncope and light-headedness.  Psychiatric/Behavioral: Negative for agitation.     Physical Exam Updated Vital Signs BP 119/79 (BP Location: Right Arm)   Pulse 75   Temp 98.7 F (37.1 C) (Oral)   Resp 18   Ht 5\' 7"  (1.702 m)   Wt 102.1 kg   LMP 06/10/2018   SpO2 100%   BMI 35.24 kg/m   Physical Exam  Constitutional: She is oriented to person, place, and time. She appears well-developed and well-nourished. No distress.  No acute distress, nontoxic-appearing.  HENT:  Head: Normocephalic and atraumatic.  Mucous memories moist.  Eyes: Pupils are equal, round, and reactive to light. Conjunctivae are normal. Right eye exhibits no discharge. Left eye exhibits no discharge.  Neck: Normal range of motion. Neck supple.  Cardiovascular: Normal rate, regular rhythm and intact distal pulses.  No murmur heard. Pulmonary/Chest: Effort normal and breath sounds normal. No stridor. No respiratory distress. She has no wheezes. She has no rales.  Abdominal:  Abdomen soft and nondistended.  Mildly tender to palpation in the epigastrium as well as left upper quadrant.  No guarding, rebound or rigidity.  No CVA tenderness.  Musculoskeletal: Normal range of motion.  Neurological: She is alert and oriented to person, place, and time. Coordination normal.  Skin: Skin is warm and dry. She is not diaphoretic.  Psychiatric: She has a normal mood and affect. Her behavior is normal.  Nursing note and vitals reviewed.  ED Treatments / Results  Labs (all labs ordered are listed, but only abnormal results are displayed) Labs Reviewed  COMPREHENSIVE METABOLIC PANEL - Abnormal; Notable for the following  components:      Result Value   Glucose, Bld 115 (*)    BUN <5 (*)    All other components within normal limits  CBC - Abnormal; Notable for the following components:   MCH 24.9 (*)    All other components within normal limits  URINALYSIS, ROUTINE W REFLEX MICROSCOPIC - Abnormal; Notable for the following components:   APPearance HAZY (*)    Nitrite POSITIVE (*)    Bacteria, UA RARE (*)    All other components within normal limits  LIPASE, BLOOD  I-STAT BETA HCG BLOOD, ED ([facility], [facility], [facility] ONLY)    EKG None  Radiology No results found.  Procedures Procedures (including critical care time)  Medications Ordered in ED Medications - No data to display   Initial Impression / Assessment and Plan / ED Course  I have reviewed the triage vital signs and the nursing notes.  Pertinent labs & imaging results that were available during my care of the patient were reviewed by me and considered in my medical  decision making (see chart for details).     Patient symptoms of burning left upper quadrant and epigastric pain after eating are consistent with gastritis.  Hemoglobin within normal limits and she denies hematemesis or melena.  Her vital signs are stable.  I do not suspect bleeding ulcer.  UA without abnormality.  I do not suspect cystitis or nephrolithiasis given her symptoms.  CMP unremarkable, liver enzymes within normal and creatinine within normal.  CBC without leukocytosis.  Lipase negative, doubt pancreatitis.  I-STAT beta hCG negative.  Abdominal exam was largely unremarkable, no peritoneal signs to suggest acute surgical abdomen.  Symptoms inconsistent with appendicitis, perforated viscus, acute bowel obstruction or diverticulitis.  We discussed utility of imaging and patient decides to hold off on CT imaging today, would like to follow-up with the GI specialist.  I will start her on Zantac and Carafate in addition to her daily Protonix.  I have recommended against ibuprofen as this may  make her symptoms worse.  I have also counseled her against alcohol use which may exacerbate her symptoms.  I discussed return precautions and she agrees and appears reliable.  Final Clinical Impressions(s) / ED Diagnoses   Final diagnoses:  Chronic gastritis without bleeding, unspecified gastritis type    ED Discharge Orders         Ordered    ranitidine (ZANTAC) 150 MG tablet  2 times daily     07/06/18 1345    sucralfate (CARAFATE) 1 g tablet  3 times daily with meals & bedtime     07/06/18 1345           Kellie ShropshireShrosbree, Markeeta Scalf J, PA-C 07/06/18 1654    Gwyneth SproutPlunkett, Whitney, MD 07/07/18 [phone]

## 2018-07-06 NOTE — ED Notes (Addendum)
Pt reports LUQ pain only when eating and it lasts about 3 hours.  She currently reports no pain but feels like there is some swelling present. Pt has not eaten today. She reports regular BM's but that she has to do enemas to go and has been using them for 3 years.

## 2018-07-06 NOTE — ED Triage Notes (Signed)
This started 6 months ago.

## 2018-07-09 ENCOUNTER — Encounter: Payer: Self-pay | Admitting: Gastroenterology

## 2018-08-16 ENCOUNTER — Encounter (INDEPENDENT_AMBULATORY_CARE_PROVIDER_SITE_OTHER): Payer: Self-pay

## 2018-08-16 ENCOUNTER — Encounter: Payer: Self-pay | Admitting: Gastroenterology

## 2018-08-16 ENCOUNTER — Ambulatory Visit: Payer: BC Managed Care – PPO | Admitting: Gastroenterology

## 2018-08-16 VITALS — BP 132/74 | HR 62 | Ht 67.0 in | Wt 220.0 lb

## 2018-08-16 DIAGNOSIS — R109 Unspecified abdominal pain: Secondary | ICD-10-CM | POA: Diagnosis not present

## 2018-08-16 MED ORDER — PANTOPRAZOLE SODIUM 40 MG PO TBEC: 40.0000 mg | DELAYED_RELEASE_TABLET | Freq: Every day | ORAL | 11 refills | Status: DC

## 2018-08-16 NOTE — Progress Notes (Signed)
Referring Provider: [facility] Primary Care Physician:  [facility]   Reason for Consultation:  Left sided abdominal pain   IMPRESSION:  Left sided postprandial abdominal pain  Small hiatal hernia 2016 Retained stool in the colon on CT 07/05/15 Frequent NSAIDs Cholecystectomy for gallstones 2001 Irritable bowel syndrome (prior diagnosis) Family history of colon polyps diagnosed at age 44 (father).  Differential for her pain includes: NSAID-related PUD, reflux esophagitis exacerbated by recent weight gain, infectious esophagitis, H pylori, gastritis, functional etiologies exacerbated by recent stressors, and less likely choledocholithiasis and/or pancreatitis.   PLAN: Protonix 40 mg daily EGD with biopsies  I recommend that she follow-up with her PCP about the abnormal T10 cystic mass seen on CT scan in 2016  I consented the patient at the bedside today discussing the risks, benefits, and alternatives to endoscopic evaluation. In particular, we discussed the risks that include, but are not limited to, reaction to medication, cardiopulmonary compromise, bleeding requiring blood transfusion, aspiration resulting in pneumonia, perforation requiring surgery, and even death. We reviewed the risk of missed lesion including polyps or even cancer. The patient acknowledges these risks and asks that we proceed.  HPI: Anna Taylor is a 42 y.o. female seen in consultation at the request of Dr. Anitra Lauth for further evaluation of left-sided abdominal pain.  Presents with a several year history of non-radiating, point-specific LUQ. Likely dates back to 2015 or 2016. But, significant more intense over the last couple of months. Hurts one hour eating and lasts several hours. + bloating.  Rare heartburn that is different from this pain. Coughing at night.  Spicy and tomato based foods are triggers for the heartburn. Unintentional weight gain of 50 pounds of the last  year. No dysphagia, odynophagia, nausea, or vomiting. No hematemesis. No change in her bowel habits. No other associated symptoms. No identified exacerbating or relieving features.   Had been taking ibrupofren 800 mg daily for the abdominal pain. Stopped when she went to the ED. Protonix 20 mg daily. Ranitidine was started but she stopped after medication was recalled.  Cutting back on wine. Exacerbated by stress. Her husband left 07/22/18.   She thinks it may be an ulcer. No prior GI evaluation but she carries a diagnosis of IBS. No prior endoscopy. She has had a cholecystectomy for cholelithiasis. The pain at that time was different from what she is experiencing now.   Review of EPIC shows a CT from 07/05/15 for LUQ abdominal pain and bloating showed minimal left intrahepatic biliary dilation, likely from cholecystectomy. A large amount of retained stool.  Cystic mass at T10. Further evaluation with MRI was recommended.   Family history of colon polyps diagnosed at age 58 (father).  Past Medical History:  Diagnosis Date  . Irritable bowel syndrome (IBS)   . Thyroid disease     Past Surgical History:  Procedure Laterality Date  . CHOLECYSTECTOMY      Prior to Admission medications   Medication Sig Start Date End Date Taking? Authorizing Provider  levothyroxine (SYNTHROID, LEVOTHROID) 150 MCG tablet Take 150 mcg by mouth daily.   Yes [provider]  pantoprazole (PROTONIX) 20 MG tablet Take 1 tablet (20 mg total) by mouth daily. 06/01/16  Yes Bethann Berkshire, MD  sertraline (ZOLOFT) 50 MG tablet Take 50 mg by mouth daily.   Yes [provider]    Current Outpatient Medications  Medication Sig Dispense Refill  . levothyroxine (SYNTHROID, LEVOTHROID) 150 MCG tablet Take 150  mcg by mouth daily.    . sertraline (ZOLOFT) 50 MG tablet Take 50 mg by mouth daily.    . pantoprazole (PROTONIX) 40 MG tablet Take 1 tablet (40 mg total) by mouth daily. 30 tablet 11   No current  facility-administered medications for this visit.     Allergies as of 08/16/2018 - Review Complete 08/16/2018  Allergen Reaction Noted  . Levaquin [levofloxacin in d5w] Other (See Comments) 08/12/2012    Family History  Problem Relation Age of Onset  . Prostate cancer Father   . Colon polyps Father   . Heart disease Maternal Grandfather   . Heart disease Paternal Grandmother   . Heart disease Paternal Grandfather     Social History   Socioeconomic History  . Marital status: Married    Spouse name: Not on file  . Number of children: Not on file  . Years of education: Not on file  . Highest education level: Not on file  Occupational History  . Not on file  Social Needs  . Financial resource strain: Not on file  . Food insecurity:    Worry: Not on file    Inability: Not on file  . Transportation needs:    Medical: Not on file    Non-medical: Not on file  Tobacco Use  . Smoking status: Never Smoker  . Smokeless tobacco: Never Used  Substance and Sexual Activity  . Alcohol use: No  . Drug use: No  . Sexual activity: Not on file  Lifestyle  . Physical activity:    Days per week: Not on file    Minutes per session: Not on file  . Stress: Not on file  Relationships  . Social connections:    Talks on phone: Not on file    Gets together: Not on file    Attends religious service: Not on file    Active member of club or organization: Not on file    Attends meetings of clubs or organizations: Not on file    Relationship status: Not on file  . Intimate partner violence:    Fear of current or ex partner: Not on file    Emotionally abused: Not on file    Physically abused: Not on file    Forced sexual activity: Not on file  Other Topics Concern  . Not on file  Social History Narrative  . Not on file    Review of Systems: 12 system ROS is negative except as noted above.   Physical Exam: Vital signs were reviewed. General:   Alert, well-nourished, pleasant and  cooperative in NAD Head:  Normocephalic and atraumatic. Eyes:  Sclera clear, no icterus.   Conjunctiva pink. Mouth:  No deformity or lesions.   Neck:  Supple; no thyromegaly. Lungs:  Clear throughout to auscultation.   No wheezes.  Heart:  Regular rate and rhythm; no murmurs Abdomen:  Soft, nontender, normal bowel sounds. Localizes her pain to the midepigastrium and RUQ. No rebound or guarding. No hepatosplenomegaly Rectal:  Deferred  Msk:  Symmetrical without gross deformities. Extremities:  No gross deformities or edema. Neurologic:  Alert and  oriented x4;  grossly nonfocal Skin:  No rash or bruise. Psych:  Alert and cooperative. Normal mood and affect.   Judyann Casasola L. Orvan Falconer Md, MPH [facility] 08/19/2018, 5:12 AM

## 2018-08-16 NOTE — Patient Instructions (Addendum)
We have sent the following medications to your pharmacy for you to pick up at your convenience: Protonix 40 mg daily.   You have been scheduled for an endoscopy. Please follow written instructions given to you at your visit today. If you use inhalers (even only as needed), please bring them with you on the day of your procedure. Your physician has requested that you go to www.startemmi.com and enter the access code given to you at your visit today. This web site gives a general overview about your procedure. However, you should still follow specific instructions given to you by our office regarding your preparation for the procedure.

## 2018-08-19 ENCOUNTER — Encounter: Payer: Self-pay | Admitting: Gastroenterology

## 2018-08-30 ENCOUNTER — Encounter: Payer: Self-pay | Admitting: Gastroenterology

## 2018-09-10 ENCOUNTER — Encounter

## 2018-09-10 ENCOUNTER — Ambulatory Visit (AMBULATORY_SURGERY_CENTER): Payer: BC Managed Care – PPO | Admitting: Gastroenterology

## 2018-09-10 ENCOUNTER — Encounter: Payer: Self-pay | Admitting: Gastroenterology

## 2018-09-10 VITALS — BP 123/75 | HR 69 | Temp 98.6°F | Resp 16 | Ht 67.0 in | Wt 220.0 lb

## 2018-09-10 DIAGNOSIS — R109 Unspecified abdominal pain: Secondary | ICD-10-CM

## 2018-09-10 DIAGNOSIS — K2970 Gastritis, unspecified, without bleeding: Secondary | ICD-10-CM

## 2018-09-10 DIAGNOSIS — K449 Diaphragmatic hernia without obstruction or gangrene: Secondary | ICD-10-CM

## 2018-09-10 MED ORDER — SODIUM CHLORIDE 0.9 % IV SOLN: 500.0000 mL | Freq: Once | INTRAVENOUS | Status: DC

## 2018-09-10 NOTE — Op Note (Signed)
[facility] Patient Name: Anna Taylor Procedure Date: 09/10/2018 10:09 AM MRN: [id number] Endoscopist: Tressia Danas MD, MD Age: 42 Referring MD:  Date of Birth: [birth year] Gender: Female Account #: [id number] Procedure:                Upper GI endoscopy Indications:              Generalized abdominal pain Medicines:                See the Anesthesia note for documentation of the                            administered medications Procedure:                Pre-Anesthesia Assessment:                           - Prior to the procedure, a History and Physical                            was performed, and patient medications and                            allergies were reviewed. The patient's tolerance of                            previous anesthesia was also reviewed. The risks                            and benefits of the procedure and the sedation                            options and risks were discussed with the patient.                            All questions were answered, and informed consent                            was obtained. Prior Anticoagulants: The patient has                            taken no previous anticoagulant or antiplatelet                            agents. ASA Grade Assessment: III - A patient with                            severe systemic disease. After reviewing the risks                            and benefits, the patient was deemed in                            satisfactory condition to undergo the procedure.  After obtaining informed consent, the endoscope was                            passed under direct vision. Throughout the                            procedure, the patient's blood pressure, pulse, and                            oxygen saturations were monitored continuously. The                            Endoscope was introduced through the mouth, and                            advanced to the  third part of duodenum. The upper                            GI endoscopy was accomplished without difficulty.                            The patient tolerated the procedure well. Scope In: Scope Out: Findings:                 The examined esophagus was normal. Biopsies were                            taken with a cold forceps for histology.                           A medium-sized hiatal hernia was present.                           The entire examined stomach was normal. Biopsies                            were taken with a cold forceps for histology.                           The examined duodenum was normal. Complications:            No immediate complications. Estimated Blood Loss:     Estimated blood loss: none. Impression:               - Normal esophagus. Biopsied.                           - Medium-sized hiatal hernia.                           - Normal stomach. Biopsied.                           - Normal examined duodenum. Recommendation:           - Await pathology results.                           -  Continue Protonix 40 mg daily.                           - Avoid all NSAIDs.                           - Return to GI office in 3 weeks. Tressia Danas MD, MD 09/10/2018 10:28:07 AM This report has been signed electronically.

## 2018-09-10 NOTE — Progress Notes (Signed)
Called to room to assist during endoscopic procedure.  Patient ID and intended procedure confirmed with present staff. Received instructions for my participation in the procedure from the performing physician.  

## 2018-09-10 NOTE — Progress Notes (Signed)
PT taken to PACU. Monitors in place. VSS. Report given to RN. 

## 2018-09-10 NOTE — Patient Instructions (Signed)
Await pathology. Medium-sized hiatal hernia *See handout Avoid all NSAIDS. Continue Protonix 40mg  daily. Return to GI clinic in 3 weeks.   YOU HAD AN ENDOSCOPIC PROCEDURE TODAY AT THE [facility]:   Refer to the procedure report that was given to you for any specific questions about what was found during the examination.  If the procedure report does not answer your questions, please call your gastroenterologist to clarify.  If you requested that your care partner not be given the details of your procedure findings, then the procedure report has been included in a sealed envelope for you to review at your convenience later.  YOU SHOULD EXPECT: Some feelings of bloating in the abdomen. Passage of more gas than usual.  Walking can help get rid of the air that was put into your GI tract during the procedure and reduce the bloating. If you had a lower endoscopy (such as a colonoscopy or flexible sigmoidoscopy) you may notice spotting of blood in your stool or on the toilet paper. If you underwent a bowel prep for your procedure, you may not have a normal bowel movement for a few days.  Please Note:  You might notice some irritation and congestion in your nose or some drainage.  This is from the oxygen used during your procedure.  There is no need for concern and it should clear up in a day or so.  SYMPTOMS TO REPORT IMMEDIATELY:    Following upper endoscopy (EGD)  Vomiting of blood or coffee ground material  New chest pain or pain under the shoulder blades  Painful or persistently difficult swallowing  New shortness of breath  Fever of 100F or higher  Black, tarry-looking stools  For urgent or emergent issues, a gastroenterologist can be reached at any hour by calling [phone].   DIET:  We do recommend a small meal at first, but then you may proceed to your regular diet.  Drink plenty of fluids but you should avoid alcoholic beverages for 24 hours.  ACTIVITY:  You should  plan to take it easy for the rest of today and you should NOT DRIVE or use heavy machinery until tomorrow (because of the sedation medicines used during the test).    FOLLOW UP: Our staff will call the number listed on your records the next business day following your procedure to check on you and address any questions or concerns that you may have regarding the information given to you following your procedure. If we do not reach you, we will leave a message.  However, if you are feeling well and you are not experiencing any problems, there is no need to return our call.  We will assume that you have returned to your regular daily activities without incident.  If any biopsies were taken you will be contacted by phone or by letter within the next 1-3 weeks.  Please call us at [phone] if you have not heard about the biopsies in 3 weeks.    SIGNATURES/CONFIDENTIALITY: You and/or your care partner have signed paperwork which will be entered into your electronic medical record.  These signatures attest to the fact that that the information above on your After Visit Summary has been reviewed and is understood.  Full responsibility of the confidentiality of this discharge information lies with you and/or your care-partner.

## 2018-09-11 ENCOUNTER — Telehealth: Payer: Self-pay

## 2018-09-11 NOTE — Telephone Encounter (Signed)
  Follow up Call-  Call back number 09/10/2018  Post procedure Call Back phone  # [phone]  Permission to leave phone message Yes  Some recent data might be hidden     Patient questions:  Do you have a fever, pain , or abdominal swelling? No. Pain Score  0 *  Have you tolerated food without any problems? Yes.    Have you been able to return to your normal activities? Yes.    Do you have any questions about your discharge instructions: Diet   No. Medications  No. Follow up visit  No.  Do you have questions or concerns about your Care? No.  Actions: * If pain score is 4 or above: No action needed, pain <4.

## 2018-09-15 ENCOUNTER — Encounter: Payer: Self-pay | Admitting: Gastroenterology

## 2018-10-01 ENCOUNTER — Ambulatory Visit: Payer: BC Managed Care – PPO | Admitting: Gastroenterology

## 2018-11-04 ENCOUNTER — Encounter: Payer: Self-pay | Admitting: Gastroenterology

## 2018-11-04 ENCOUNTER — Ambulatory Visit: Payer: BC Managed Care – PPO | Admitting: Gastroenterology

## 2018-11-04 VITALS — BP 108/80 | HR 91 | Ht 66.5 in | Wt 212.0 lb

## 2018-11-04 DIAGNOSIS — K449 Diaphragmatic hernia without obstruction or gangrene: Secondary | ICD-10-CM

## 2018-11-04 DIAGNOSIS — R109 Unspecified abdominal pain: Secondary | ICD-10-CM | POA: Diagnosis not present

## 2018-11-04 MED ORDER — PANTOPRAZOLE SODIUM 40 MG PO TBEC: 40.0000 mg | DELAYED_RELEASE_TABLET | Freq: Every day | ORAL | 3 refills | Status: DC

## 2018-11-04 NOTE — Patient Instructions (Signed)
Continue Protonix 40 mg daily (refills to [facility]) Consider tapering trial of Protonix after finishing 8 week. I recommend that you follow-up with your primary care provider about the abnormal T10 cystic mass seen on CT scan in 2016. Dr. Barron Alvine is accepting new patients

## 2018-11-04 NOTE — Progress Notes (Signed)
Referring Provider: [facility] Primary Care Physician:  [facility]   Reason for Consultation:  Left sided abdominal pain   IMPRESSION:  Left sided postprandial abdominal pain with H pylori negative gastritis 09/10/18 Small hiatal hernia 2016 Medium hiatal hernia on EGD 09/10/18 Retained stool in the colon on CT 07/05/15 Frequent NSAIDs Cholecystectomy for gallstones 2001 Irritable bowel syndrome (prior diagnosis) Family history of colon polyps diagnosed at age 10 (father).  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 Clinically improving on PPI therapy.  PLAN: Protonix 40 mg daily ([facility]ills [facility])    - consider tapering trial after completing 8 weeks of treatment Avoid NSAIDs as able I recommend that she follow-up with her PCP about the abnormal T10 cystic mass seen on CT scan in 2016 Return to clinic as needed Start colon cancer screening at age 19  HPI: Anna Taylor is a 43 y.o. female returns after her initial consultation for left-sided abdominal pain. EGD 09/10/18 showed a medium-sized hiatal hernia. Esophageal biopsies were negative. 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 She returns in scheduled follow-up.   Overall feeling better. No significant burning. When really anxious or worried she will note the burning sensation. Watching her portions. No ongoing abdominal pain. No new complaints or concerns.   Previously reported: a several year history of non-radiating, point-specific LUQ. Likely dates back to 2015 or 2016. But, significant more intense over the last couple of months. Hurts one hour eating and lasts several hours. + bloating.  Rare heartburn that is different from this pain. Coughing at night.  Spicy and tomato based foods are triggers for the heartburn. Unintentional weight gain of 50 pounds of the  last year. No dysphagia, odynophagia, nausea, or vomiting. No hematemesis. No change in her bowel habits. No other associated symptoms. No identified exacerbating or relieving features.  Had been taking ibrupofren 800 mg daily for the abdominal pain. Stopped when she went to the ED. Protonix 20 mg daily. Ranitidine was started but she stopped after medication was recalled.  Cutting back on wine. Exacerbated by stress. Her husband left 07/22/18.   Past Medical History:  Diagnosis Date  . Anxiety   . Depression   . GERD (gastroesophageal reflux disease)   . Irritable bowel syndrome (IBS)   . Thyroid disease     Past Surgical History:  Procedure Laterality Date  . CHOLECYSTECTOMY    . COLONOSCOPY      Prior to Admission medications   Medication Sig Start Date End Date Taking? Authorizing Provider  levothyroxine (SYNTHROID, LEVOTHROID) 150 MCG tablet Take 150 mcg by mouth daily.   Yes [provider]  pantoprazole (PROTONIX) 20 MG tablet Take 1 tablet (20 mg total) by mouth daily. 06/01/16  Yes Bethann Berkshire, MD  sertraline (ZOLOFT) 50 MG tablet Take 50 mg by mouth daily.   Yes [provider]    Current Outpatient Medications  Medication Sig Dispense Refill  . levothyroxine (SYNTHROID, LEVOTHROID) 150 MCG tablet Take 150 mcg by mouth daily.    . pantoprazole (PROTONIX) 40 MG tablet Take 1 tablet (40 mg total) by mouth daily. 30 tablet 11  . sertraline (ZOLOFT) 50 MG tablet Take 50 mg by mouth daily.     No current facility-administered medications for this visit.     Allergies as of 11/04/2018 - Review Complete 11/04/2018  Allergen Reaction Noted  .  Levaquin [levofloxacin in d5w] Other (See Comments) 08/12/2012    Family History  Problem Relation Age of Onset  . Prostate cancer Father   . Colon polyps Father   . Heart disease Maternal Grandfather   . Heart disease Paternal Grandmother   . Heart disease Paternal Grandfather   . Colon cancer Neg Hx   .  Esophageal cancer Neg Hx   . Rectal cancer Neg Hx   . Stomach cancer Neg Hx     Social History   Socioeconomic History  . Marital status: Married    Spouse name: Not on file  . Number of children: Not on file  . Years of education: Not on file  . Highest education level: Not on file  Occupational History  . Not on file  Social Needs  . Financial resource strain: Not on file  . Food insecurity:    Worry: Not on file    Inability: Not on file  . Transportation needs:    Medical: Not on file    Non-medical: Not on file  Tobacco Use  . Smoking status: Never Smoker  . Smokeless tobacco: Never Used  Substance and Sexual Activity  . Alcohol use: Yes    Comment: occasional wine  . Drug use: No  . Sexual activity: Yes    Partners: Male  Lifestyle  . Physical activity:    Days per week: Not on file    Minutes per session: Not on file  . Stress: Not on file  Relationships  . Social connections:    Talks on phone: Not on file    Gets together: Not on file    Attends religious service: Not on file    Active member of club or organization: Not on file    Attends meetings of clubs or organizations: Not on file    Relationship status: Not on file  . Intimate partner violence:    Fear of current or ex partner: Not on file    Emotionally abused: Not on file    Physically abused: Not on file    Forced sexual activity: Not on file  Other Topics Concern  . Not on file  Social History Narrative  . Not on file    Physical Exam: Vital signs were reviewed. General:   Alert, well-nourished, pleasant and cooperative in NAD Head:  Normocephalic and atraumatic. Eyes:  Sclera clear, no icterus.   Conjunctiva pink. Mouth:  No deformity or lesions.   Neck:  Supple; no thyromegaly. Lungs:  Clear throughout to auscultation.   No wheezes. Heart:  Regular rate and rhythm; no murmurs Abdomen:  Soft, nontender, normal bowel sounds. Localizes her pain to the midepigastrium and RUQ. No  rebound or guarding. No hepatosplenomegaly Neurologic:  Alert and  oriented x4;  grossly nonfocal Skin:  No rash or bruise. Psych:  Alert and cooperative. Normal mood and affect.   Shanti Agresti L. Orvan Falconer Md, MPH [facility] 11/04/2018, 3:43 PM

## 2018-11-12 ENCOUNTER — Encounter: Payer: Self-pay | Admitting: Gastroenterology

## 2019-09-22 ENCOUNTER — Other Ambulatory Visit: Payer: Self-pay

## 2019-09-22 DIAGNOSIS — Z20822 Contact with and (suspected) exposure to covid-19: Secondary | ICD-10-CM

## 2019-09-23 LAB — NOVEL CORONAVIRUS, NAA: SARS-CoV-2, NAA: NOT DETECTED

## 2019-10-13 ENCOUNTER — Other Ambulatory Visit: Payer: Self-pay | Admitting: Gastroenterology

## 2022-03-23 DIAGNOSIS — R87810 Cervical high risk human papillomavirus (HPV) DNA test positive: Secondary | ICD-10-CM | POA: Insufficient documentation

## 2022-03-23 DIAGNOSIS — B977 Papillomavirus as the cause of diseases classified elsewhere: Secondary | ICD-10-CM | POA: Insufficient documentation

## 2022-03-23 HISTORY — DX: Cervical high risk human papillomavirus (HPV) DNA test positive: R87.810

## 2022-05-15 DIAGNOSIS — R87610 Atypical squamous cells of undetermined significance on cytologic smear of cervix (ASC-US): Secondary | ICD-10-CM | POA: Insufficient documentation

## 2022-05-15 HISTORY — DX: Atypical squamous cells of undetermined significance on cytologic smear of cervix (ASC-US): R87.610

## 2022-11-27 ENCOUNTER — Other Ambulatory Visit: Payer: Self-pay

## 2022-11-27 ENCOUNTER — Emergency Department
Admission: EM | Admit: 2022-11-27 | Discharge: 2022-11-27 | Disposition: A | Discharge status: Home / Self Care | Payer: BC Managed Care – PPO | Attending: Emergency Medicine | Admitting: Emergency Medicine

## 2022-11-27 ENCOUNTER — Encounter: Payer: Self-pay | Admitting: Emergency Medicine

## 2022-11-27 DIAGNOSIS — M545 Low back pain, unspecified: Secondary | ICD-10-CM | POA: Insufficient documentation

## 2022-11-27 MED ORDER — NAPROXEN 500 MG PO TABS: 500.0000 mg | ORAL_TABLET | Freq: Two times a day (BID) | ORAL | 0 refills | Status: AC

## 2022-11-27 MED ORDER — DEXAMETHASONE SODIUM PHOSPHATE 10 MG/ML IJ SOLN
10.0000 mg | Freq: Once | INTRAMUSCULAR | Status: AC
  Administered 2022-11-27: 10 mg via INTRAVENOUS
  Filled 2022-11-27: qty 1

## 2022-11-27 MED ORDER — KETOROLAC TROMETHAMINE 15 MG/ML IJ SOLN
15.0000 mg | Freq: Once | INTRAMUSCULAR | Status: AC
  Administered 2022-11-27: 15 mg via INTRAVENOUS
  Filled 2022-11-27: qty 1

## 2022-11-27 MED ORDER — MORPHINE SULFATE (PF) 4 MG/ML IV SOLN
4.0000 mg | Freq: Once | INTRAVENOUS | Status: AC
  Administered 2022-11-27: 4 mg via INTRAVENOUS
  Filled 2022-11-27: qty 1

## 2022-11-27 MED ORDER — LIDOCAINE 5 % EX PTCH: 1.0000 | MEDICATED_PATCH | Freq: Two times a day (BID) | CUTANEOUS | 0 refills | Status: AC

## 2022-11-27 MED ORDER — PREDNISONE 10 MG (21) PO TBPK: ORAL_TABLET | ORAL | 0 refills | Status: DC

## 2022-11-27 MED ORDER — LIDOCAINE 5 % EX PTCH
1.0000 | MEDICATED_PATCH | CUTANEOUS | Status: DC
  Administered 2022-11-27: 1 via TRANSDERMAL
  Filled 2022-11-27: qty 1

## 2022-11-27 NOTE — Discharge Instructions (Addendum)
Please return to the emergency department for any new, worsening, or changing symptoms or other concerns including weakness in your legs, urinary or stool incontinence or retention, numbness or tingling in your extremities/buttocks/groin, fevers, or any other concerns or change in symptoms.  You may take the medications as prescribed.  Please follow-up with outpatient provider.  It was a pleasure caring for you today. Do not do any heavy lifting until your symptoms have resolved completely.

## 2022-11-27 NOTE — ED Provider Notes (Signed)
[facility] Provider Note    Event Date/Time   First MD Initiated Contact with Patient 11/27/22 1345     (approximate)   History   Back Pain   HPI  Anna Taylor is a 47 y.o. female who presents today with low back pain that began yesterday after picking up a case of water.  Patient reports that the pain is across her low back and does not radiate into her abdomen or down her legs.  She denies weakness in her legs.  No fevers or chills.  No nausea or vomiting.  She still able to ambulate.  She took Advil this morning.  She reports that her symptoms are worsened with movement.  There are no problems to display for this patient.          Physical Exam   Triage Vital Signs: ED Triage Vitals  Enc Vitals Group     BP 11/27/22 1329 136/81     Pulse Rate 11/27/22 1329 88     Resp 11/27/22 1329 18     Temp 11/27/22 1329 98.4 F (36.9 C)     Temp Source 11/27/22 1329 Oral     SpO2 11/27/22 1329 99 %     Weight 11/27/22 1322 230 lb (104.3 kg)     Height 11/27/22 1322 5' 6"$  (1.676 m)     Head Circumference --      Peak Flow --      Pain Score 11/27/22 1322 6     Pain Loc --      Pain Edu? --      Excl. in [place]? --     Most recent vital signs: Vitals:   11/27/22 1329 11/27/22 1447  BP: 136/81 128/74  Pulse: 88 70  Resp: 18 16  Temp: 98.4 F (36.9 C) 98.4 F (36.9 C)  SpO2: 99% 99%    Physical Exam Vitals and nursing note reviewed.  Constitutional:      General: Awake and alert. No acute distress.    Appearance: Normal appearance. The patient is overweight.  HENT:     Head: Normocephalic and atraumatic.     Mouth: Mucous membranes are moist.  Eyes:     General: PERRL. Normal EOMs        Right eye: No discharge.        Left eye: No discharge.     Conjunctiva/sclera: Conjunctivae normal.  Cardiovascular:     Rate and Rhythm: Normal rate and regular rhythm.     Pulses: Normal pulses.     Heart sounds: Normal heart  sounds Pulmonary:     Effort: Pulmonary effort is normal. No respiratory distress.     Breath sounds: Normal breath sounds.  Abdominal:     Abdomen is soft. There is no abdominal tenderness. No rebound or guarding. No distention. Musculoskeletal:        General: No swelling. Normal range of motion.     Cervical back: Normal range of motion and neck supple.  Back: No midline tenderness.  Tenderness to bilateral lumbar paraspinal muscles, though worse to the left.  Strength and sensation 5/5 to bilateral lower extremities. Normal great toe extension against resistance. Normal sensation throughout feet. Normal patellar reflexes. Negative SLR and opposite SLR bilaterally. Negative FABER test Skin:    General: Skin is warm and dry.     Capillary Refill: Capillary refill takes less than 2 seconds.     Findings: No rash.  Neurological:  Mental Status: The patient is awake and alert.      ED Results / Procedures / Treatments   Labs (all labs ordered are listed, but only abnormal results are displayed) Labs Reviewed - No data to display   EKG     RADIOLOGY     PROCEDURES:  Critical Care performed:   Procedures   MEDICATIONS ORDERED IN ED: Medications  lidocaine (LIDODERM) 5 % 1 patch (1 patch Transdermal Patch Applied 11/27/22 1359)  dexamethasone (DECADRON) injection 10 mg (10 mg Intravenous Given 11/27/22 1403)  ketorolac (TORADOL) 15 MG/ML injection 15 mg (15 mg Intravenous Given 11/27/22 1403)  morphine (PF) 4 MG/ML injection 4 mg (4 mg Intravenous Given 11/27/22 1404)     IMPRESSION / MDM / ASSESSMENT AND PLAN / ED COURSE  I reviewed the triage vital signs and the nursing notes.   Differential diagnosis includes, but is not limited to, radiculopathy, muscle spasm, strain, disc herniation, less likely vertebral injury.  Patient is awake and alert, hemodynamically stable and afebrile.  She has 5 out of 5 strength with intact sensation to extensor hallucis dorsiflexion  and plantarflexion of bilateral lower extremities with normal patellar reflexes bilaterally. Most likely etiology at this point is muscle strain vs herniated disc given her physical exam and her mechanism of injury. No red flags to indicate patient is at risk for more auspicious process that would require urgent/emergent spinal imaging or subspecialty evaluation at this time. No major trauma, no midline tenderness, no history or physical exam findings to suggest cauda equina syndrome or spinal cord compression. No focal neurological deficits on exam. No constitutional symptoms or history of immunosuppression or IVDA to suggest potential for epidural abscess. Not anticoagulated, no history of bleeding diastasis to suggest risk for epidural hematoma. No chronic steroid use or advanced age or history of malignancy to suggest proclivity towards pathological fracture.  No abdominal pain or flank pain to suggest kidney stone, no history of kidney stone.  No fever or dysuria or CVAT to suggest pyelonephritis .  No chest pain, back pain, shortness of breath, neurological deficits, to suggest vascular catastrophe, and pulses are equal in all 4 extremities.  She was treated symptomatically with significant improvement of her symptoms.  Discussed care instructions and return precautions with patient. Recommended close outpatient follow-up for re-evaluation. Patient agrees with plan of care. Will treat the patient symptomatically as needed for pain control. Will discharge patient to take these medications and return for any worsening or different pain or development of any neurologic symptoms. Educated patient regarding expected time course for back pain to improve and recommended very close outpatient follow-up.  She was discharged with her family member who is driving.  She was given the appropriate follow-up information and understand strict return precautions.   Patient's presentation is most consistent with acute  complicated illness / injury requiring diagnostic workup.   Clinical Course as of 11/27/22 1453  Mon Nov 27, 2022  1438 Patient feels improved and ready for discharge [JP]    Clinical Course User Index [JP] Jeffre Enriques, Clarnce Flock, PA-C     FINAL CLINICAL IMPRESSION(S) / ED DIAGNOSES   Final diagnoses:  Acute bilateral low back pain without sciatica     Rx / DC Orders   ED Discharge Orders          Ordered    predniSONE (STERAPRED UNI-PAK 21 TAB) 10 MG (21) TBPK tablet        11/27/22 1440    naproxen (NAPROSYN) 500  MG tablet  2 times daily with meals        11/27/22 1440    lidocaine (LIDODERM) 5 %  Every 12 hours        11/27/22 1440             Note:  This document was prepared using Dragon voice recognition software and may include unintentional dictation errors.   Emeline Gins 11/27/22 1453    Lavonia Drafts, MD 11/27/22 1453

## 2022-11-27 NOTE — ED Triage Notes (Signed)
Pt with low back pain that started Saturday after picking up a case of water. Pt does not have history of back pain. Pt took advil at 10:00 today.

## 2022-12-01 NOTE — Progress Notes (Deleted)
Referring Physician:  Lavonia Drafts, MD [place],  [place] [id number]  Primary Physician:  [facility]  History of Present Illness: 12/01/2022*** Ms. Donnice Ritzman has a history of thyroid disease.   Seen in ED on 11/27/22 for LBP that started on 11/26/22 when picking up a case of water. No leg pain.    Given dose pack and naproxen from ED along with lidoderm patches.   Duration: *** Location: *** Quality: *** Severity: ***  Precipitating: aggravated by *** Modifying factors: made better by *** Weakness: none Timing: *** Bowel/Bladder Dysfunction: none  Conservative measures:  Physical therapy: ***  Multimodal medical therapy including regular antiinflammatories: prednisone, naproxen, lidoderm patches  Injections: *** epidural steroid injections  Past Surgery: ***  Hassie P Grossberg has ***no symptoms of cervical myelopathy.  The symptoms are causing a significant impact on the patient's life.   Review of Systems:  A 10 point review of systems is negative, except for the pertinent positives and negatives detailed in the HPI.  Past Medical History: Past Medical History:  Diagnosis Date   Anxiety    Depression    GERD (gastroesophageal reflux disease)    Irritable bowel syndrome (IBS)    Thyroid disease     Past Surgical History: Past Surgical History:  Procedure Laterality Date   CHOLECYSTECTOMY     COLONOSCOPY      Allergies: Allergies as of 12/05/2022 - Review Complete 11/27/2022  Allergen Reaction Noted   Levaquin [levofloxacin in d5w] Other (See Comments) 08/12/2012    Medications: Outpatient Encounter Medications as of 12/05/2022  Medication Sig   levothyroxine (SYNTHROID, LEVOTHROID) 150 MCG tablet Take 150 mcg by mouth daily.   lidocaine (LIDODERM) 5 % Place 1 patch onto the skin every 12 (twelve) hours for 5 days. Remove & Discard patch within 12 hours or as directed by MD   naproxen (NAPROSYN) 500 MG  tablet Take 1 tablet (500 mg total) by mouth 2 (two) times daily with a meal for 7 days.   pantoprazole (PROTONIX) 40 MG tablet TAKE 1 TABLET BY MOUTH EVERY DAY   predniSONE (STERAPRED UNI-PAK 21 TAB) 10 MG (21) TBPK tablet Take 6 tablets at once by mouth on day 1 and decrease by 1 tablet for each subsequent day   sertraline (ZOLOFT) 50 MG tablet Take 50 mg by mouth daily.   No facility-administered encounter medications on file as of 12/05/2022.    Social History: Social History   Tobacco Use   Smoking status: Never   Smokeless tobacco: Never  Vaping Use   Vaping Use: Never used  Substance Use Topics   Alcohol use: Yes    Comment: occasional wine   Drug use: No    Family Medical History: Family History  Problem Relation Age of Onset   Prostate cancer Father    Colon polyps Father    Heart disease Maternal Grandfather    Heart disease Paternal Grandmother    Heart disease Paternal Grandfather    Colon cancer Neg Hx    Esophageal cancer Neg Hx    Rectal cancer Neg Hx    Stomach cancer Neg Hx     Physical Examination: There were no vitals filed for this visit.  General: Patient is well developed, well nourished, calm, collected, and in no apparent distress. Attention to examination is appropriate.  Respiratory: Patient is breathing without any difficulty.   NEUROLOGICAL:     Awake, alert, oriented to person, place, and time.  Speech is clear and fluent. Fund of knowledge is appropriate.   Cranial Nerves: Pupils equal round and reactive to light.  Facial tone is symmetric.    *** ROM of cervical spine *** pain *** posterior cervical tenderness. *** tenderness in bilateral trapezial region.   *** ROM of lumbar spine *** pain *** posterior lumbar tenderness.   No abnormal lesions on exposed skin.   Strength: Side Biceps Triceps Deltoid Interossei Grip Wrist Ext. Wrist Flex.  R 5 5 5 5 5 5 5  $ L 5 5 5 5 5 5 5   $ Side Iliopsoas Quads Hamstring PF DF EHL  R 5 5 5  5 5 5  $ L 5 5 5 5 5 5   $ Reflexes are ***2+ and symmetric at the biceps, triceps, brachioradialis, patella and achilles.   Hoffman's is absent.  Clonus is not present.   Bilateral upper and lower extremity sensation is intact to light touch.     Gait is normal.   ***No difficulty with tandem gait.    Medical Decision Making  Imaging: No lumbar imaging. ***  Assessment and Plan: Ms. Schoof is a pleasant 47 y.o. female has ***  Treatment options discussed with patient and following plan made:   - Order for physical therapy for *** spine ***. Patient to call to schedule appointment. *** - Continue current medications including ***. Reviewed dosing and side effects.  - Prescription for ***. Reviewed dosing and side effects. Take with food.  - Prescription for *** to take prn muscle spasms. Reviewed dosing and side effects. Discussed this can cause drowsiness.  - MRI of *** to further evaluate *** radiculopathy. No improvement time or medications (***).  - Referral to PM[facility] at [facility] to discuss possible *** injections.  - Will schedule phone visit to review MRI results once I get them back.   I spent a total of *** minutes in face-to-face and non-face-to-face activities related to this patient's care today including review of outside records, review of imaging, review of symptoms, physical exam, discussion of differential diagnosis, discussion of treatment options, and documentation.   Thank you for involving me in the care of this patient.   Geronimo Boot PA-C Dept. of Neurosurgery

## 2022-12-05 ENCOUNTER — Ambulatory Visit: Payer: BC Managed Care – PPO | Admitting: Orthopedic Surgery

## 2022-12-11 ENCOUNTER — Ambulatory Visit
Admission: RE | Admit: 2022-12-11 | Discharge: 2022-12-11 | Disposition: A | Discharge status: Home / Self Care | Payer: BC Managed Care – PPO | Attending: Orthopedic Surgery | Admitting: Orthopedic Surgery

## 2022-12-11 ENCOUNTER — Encounter: Payer: Self-pay | Admitting: Orthopedic Surgery

## 2022-12-11 ENCOUNTER — Ambulatory Visit (INDEPENDENT_AMBULATORY_CARE_PROVIDER_SITE_OTHER): Payer: BC Managed Care – PPO | Admitting: Orthopedic Surgery

## 2022-12-11 ENCOUNTER — Ambulatory Visit
Admission: RE | Admit: 2022-12-11 | Discharge: 2022-12-11 | Disposition: A | Discharge status: Home / Self Care | Payer: BC Managed Care – PPO | Source: Ambulatory Visit | Attending: Orthopedic Surgery | Admitting: Orthopedic Surgery

## 2022-12-11 VITALS — BP 115/60 | Ht 66.0 in | Wt 230.0 lb

## 2022-12-11 DIAGNOSIS — M5416 Radiculopathy, lumbar region: Secondary | ICD-10-CM

## 2022-12-11 DIAGNOSIS — M5442 Lumbago with sciatica, left side: Secondary | ICD-10-CM

## 2022-12-11 MED ORDER — METHOCARBAMOL 500 MG PO TABS: 500.0000 mg | ORAL_TABLET | Freq: Three times a day (TID) | ORAL | 0 refills | Status: DC | PRN

## 2022-12-11 MED ORDER — NAPROXEN 500 MG PO TBEC: 500.0000 mg | DELAYED_RELEASE_TABLET | Freq: Two times a day (BID) | ORAL | 1 refills | Status: DC

## 2022-12-11 NOTE — Progress Notes (Signed)
Referring Physician:  No referring provider defined for this encounter.  Primary Physician:  [facility]  History of Present Illness: 12/11/2022 Anna Taylor has a history of thyroid disease.   Seen in ED on 11/27/22 with LBP that started on 11/26/22 after picking up a case of water. Pain is radiating down the leg into the thigh, unable to sleep.   She has constant left sided LBP with left posterior thigh pain. No pain below knee. No specific aggravating/alleviating factors. No right leg pain. Having trouble sleeping at night due to pain.   She has seen improvement since taking meds from ED. She was given lidoderm patches, naproxen, and prednisone.   Bowel/Bladder Dysfunction: none  Conservative measures:  Physical therapy: none Multimodal medical therapy including regular antiinflammatories: lidoderm patches, naproxen, prednisone  Injections: No epidural steroid injections none  Past Surgery: no spinal surgery  Anna Taylor has no symptoms of cervical myelopathy.  The symptoms are causing a significant impact on the patient's life.   Review of Systems:  A 10 point review of systems is negative, except for the pertinent positives and negatives detailed in the HPI.  Past Medical History: Past Medical History:  Diagnosis Date   Anxiety    Depression    GERD (gastroesophageal reflux disease)    Irritable bowel syndrome (IBS)    Thyroid disease     Past Surgical History: Past Surgical History:  Procedure Laterality Date   CHOLECYSTECTOMY     COLONOSCOPY      Allergies: Allergies as of 12/11/2022 - Review Complete 12/11/2022  Allergen Reaction Noted   Levaquin [levofloxacin in d5w] Other (See Comments) 08/12/2012    Medications: Outpatient Encounter Medications as of 12/11/2022  Medication Sig   levothyroxine (SYNTHROID, LEVOTHROID) 150 MCG tablet Take 150 mcg by mouth daily.   pantoprazole (PROTONIX) 40 MG tablet TAKE 1 TABLET  BY MOUTH EVERY DAY   sertraline (ZOLOFT) 50 MG tablet Take 50 mg by mouth daily.   [DISCONTINUED] predniSONE (STERAPRED UNI-PAK 21 TAB) 10 MG (21) TBPK tablet Take 6 tablets at once by mouth on day 1 and decrease by 1 tablet for each subsequent day   No facility-administered encounter medications on file as of 12/11/2022.    Social History: Social History   Tobacco Use   Smoking status: Never   Smokeless tobacco: Never  Vaping Use   Vaping Use: Never used  Substance Use Topics   Alcohol use: Yes    Comment: occasional wine   Drug use: No    Family Medical History: Family History  Problem Relation Age of Onset   Prostate cancer Father    Colon polyps Father    Heart disease Maternal Grandfather    Heart disease Paternal Grandmother    Heart disease Paternal Grandfather    Colon cancer Neg Hx    Esophageal cancer Neg Hx    Rectal cancer Neg Hx    Stomach cancer Neg Hx     Physical Examination: Vitals:   12/11/22 1317  BP: 115/60    General: Patient is well developed, well nourished, calm, collected, and in no apparent distress. Attention to examination is appropriate.  Respiratory: Patient is breathing without any difficulty.   NEUROLOGICAL:     Awake, alert, oriented to person, place, and time.  Speech is clear and fluent. Fund of knowledge is appropriate.   Cranial Nerves: Pupils equal round and reactive to light.  Facial tone is symmetric.    Limited ROM  of lumbar spine wtih pain Mild left sided posterior lumbar tenderness.   No abnormal lesions on exposed skin.   Strength: Side Biceps Triceps Deltoid Interossei Grip Wrist Ext. Wrist Flex.  R '5 5 5 5 5 5 5  '$ L '5 5 5 5 5 5 5   '$ Side Iliopsoas Quads Hamstring PF DF EHL  R '5 5 5 5 5 5  '$ L '5 5 5 5 5 5   '$ Reflexes are 2+ and symmetric at the biceps, triceps, brachioradialis, patella and achilles.   Hoffman's is absent.  Clonus is not present.   Bilateral upper and lower extremity sensation is intact to light  touch.     Gait is normal.    Medical Decision Making  Imaging: None  Assessment and Plan: [name] is a pleasant 47 y.o. female has 2 week history of constant left sided LBP with left posterior thigh pain. No pain below knee. No right leg pain.   No lumbar imaging. Back and left leg pain appear to be lumbar mediated. She has seen significant improvement since finishing dose pack.   Treatment options discussed with patient and following plan made:   - Lumbar xrays ordered. Will message her with results.  - Order for physical therapy for lumbar spine to [facility] in [place].  - Prescription for naproxen. Reviewed dosing and side effects. Take with food. She is on zoloft- discussed risk of GI bleeding. Will stop if any symptoms.  - Prescription for robaxin to take prn muscle spasms. Reviewed dosing and side effects. Discussed this can cause drowsiness.  - She is first grade teacher. 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  - If no improvement with above, will consider lumbar MRI.  - Will message her in 2 weeks to check on her.  - Follow up scheduled with me in 6-8 weeks and prn.   I spent a total of 35 minutes in face-to-face and non-face-to-face activities related to this patient's care today including review of outside records, review of imaging, review of symptoms, physical exam, discussion of differential diagnosis, discussion of treatment options, and documentation.   Thank you for involving me in the care of this patient.   Geronimo Boot PA-C Dept. of Neurosurgery

## 2022-12-11 NOTE — Patient Instructions (Signed)
It was so nice to see you today. Thank you so much for coming in.    I ordered xrays of your lower back. You can go across the street to get these at [place] (building with the white pillars). You do not need any appointment. I will message you with results.   I sent physical therapy orders to [facility] in [place]. You can call them at [phone] if you don't hear from them to schedule your visit.   I sent a prescription for naproxen to help with pain and inflammation. Take as directed with food. Stop if any stomach upset, nausea/vomiting, or blood in your stool.   I also sent a prescription for methocarbamol to help with muscle spasms. Use only as needed and be careful, this can make you sleepy.   I will see you back in 6-8 weeks. Please do not hesitate to call if you have any questions or concerns. You can also message me in [place].   Keep in touch and let me know if things get worse or if you need a work note.   Geronimo Boot PA-C [phone]

## 2022-12-12 DIAGNOSIS — M5136 Other intervertebral disc degeneration, lumbar region: Secondary | ICD-10-CM

## 2022-12-12 DIAGNOSIS — M47816 Spondylosis without myelopathy or radiculopathy, lumbar region: Secondary | ICD-10-CM

## 2022-12-12 DIAGNOSIS — M5416 Radiculopathy, lumbar region: Secondary | ICD-10-CM

## 2022-12-13 NOTE — Telephone Encounter (Signed)
Xrays of lumbar spine dated 12/11/22:  FINDINGS: No acute fracture or traumatic malalignment. Moderate disc space height loss at L5-S1. Moderate lower lumbar facet arthropathy. No instability on flexion or extension.   IMPRESSION: 1. No acute fracture or traumatic malalignment of the lumbar spine. 2. Moderate lower lumbar degenerative changes.     Electronically Signed   By: Placido Sou M.D.   On: 12/12/2022 23:25  I have personally reviewed the images and agree with the above interpretation.

## 2022-12-13 NOTE — Telephone Encounter (Signed)
X ray results 

## 2022-12-18 NOTE — Addendum Note (Signed)
Addended byGeronimo Boot on: 12/18/2022 08:02 AM   Modules accepted: Orders

## 2022-12-19 MED ORDER — GABAPENTIN 300 MG PO CAPS: ORAL_CAPSULE | ORAL | 1 refills | Status: DC

## 2022-12-19 NOTE — Addendum Note (Signed)
Addended byGeronimo Boot on: 12/19/2022 12:49 PM   Modules accepted: Orders

## 2022-12-29 NOTE — Telephone Encounter (Signed)
I would start with 6 weeks.

## 2023-01-03 ENCOUNTER — Ambulatory Visit
Admission: RE | Admit: 2023-01-03 | Discharge: 2023-01-03 | Disposition: A | Discharge status: Home / Self Care | Payer: BC Managed Care – PPO | Source: Ambulatory Visit | Attending: Orthopedic Surgery | Admitting: Orthopedic Surgery

## 2023-01-03 DIAGNOSIS — M5416 Radiculopathy, lumbar region: Secondary | ICD-10-CM | POA: Diagnosis present

## 2023-01-03 DIAGNOSIS — M47816 Spondylosis without myelopathy or radiculopathy, lumbar region: Secondary | ICD-10-CM

## 2023-01-03 DIAGNOSIS — M51369 Other intervertebral disc degeneration, lumbar region without mention of lumbar back pain or lower extremity pain: Secondary | ICD-10-CM

## 2023-01-03 DIAGNOSIS — M5136 Other intervertebral disc degeneration, lumbar region: Secondary | ICD-10-CM

## 2023-01-09 NOTE — Telephone Encounter (Signed)
Please change her to in person visit with me on Wednesday (was a phone call).

## 2023-01-10 ENCOUNTER — Telehealth: Payer: BC Managed Care – PPO | Admitting: Orthopedic Surgery

## 2023-01-10 ENCOUNTER — Encounter: Payer: Self-pay | Admitting: Orthopedic Surgery

## 2023-01-10 ENCOUNTER — Ambulatory Visit: Payer: BC Managed Care – PPO | Admitting: Orthopedic Surgery

## 2023-01-10 VITALS — BP 115/76 | HR 95 | Ht 66.0 in | Wt 230.0 lb

## 2023-01-10 DIAGNOSIS — M5416 Radiculopathy, lumbar region: Secondary | ICD-10-CM | POA: Diagnosis not present

## 2023-01-10 DIAGNOSIS — M5126 Other intervertebral disc displacement, lumbar region: Secondary | ICD-10-CM

## 2023-01-10 DIAGNOSIS — Q349 Congenital malformation of respiratory system, unspecified: Secondary | ICD-10-CM

## 2023-01-10 DIAGNOSIS — R29898 Other symptoms and signs involving the musculoskeletal system: Secondary | ICD-10-CM | POA: Diagnosis not present

## 2023-01-10 DIAGNOSIS — IMO0002 Reserved for concepts with insufficient information to code with codable children: Secondary | ICD-10-CM

## 2023-01-10 NOTE — H&P (View-Only) (Signed)
  Referring Physician:  Llc, [facility] [address] [place],  [place] [address]  Primary Physician:  Llc, [facility]  History of Present Illness: 01/11/2023 [name] is here today with a chief complaint of .low back pain that radiates into the left posterior thigh and down towards her heel.  She began having left leg pain approximately 7 weeks ago after she picked up a case of water on February 11.  She now has severe pain that is been worsening.  She has trouble going about her day-to-day life.  She has pain with standing.  Nothing really helps.  Her pain is as bad as 10 out of 10.    She has done some physical therapy without improvement.  She noted weakness recently, and comes in today for reevaluation.    The symptoms are causing a significant impact on the patient's life.   Progress Note from Stacy Luna, PA on 01/10/23: [name] has a history of thyroid disease.    Seen in ED on 11/27/22 with LBP that started on 11/26/22 after picking up a case of water. Pain is radiating down the leg into the thigh, unable to sleep.    She has constant left sided LBP with left posterior leg pain to her foot. No specific aggravating/alleviating factors. No right leg pain. Having trouble sleeping at night due to pain. She has been out of work due to pain.    She has done a few visits of PT and it has not helped.    She is on neurontin, robaxin, and naproxen with minimal relief.     Bowel/Bladder Dysfunction: none   Conservative measures:  Physical therapy: has done a few visits at [facility] therapy including regular antiinflammatories: lidoderm patches, naproxen, prednisone , neurontin Injections: No epidural steroid injections    Past Surgery: no spinal surgery   Anna Taylor has no symptoms of cervical myelopathy.   The symptoms are causing a significant impact on the patient's life.   I have  utilized the care everywhere function in epic to review the outside records available from external health systems.  Review of Systems:  A 10 point review of systems is negative, except for the pertinent positives and negatives detailed in the HPI.  Past Medical History: Past Medical History:  Diagnosis Date   Anxiety    Depression    GERD (gastroesophageal reflux disease)    Irritable bowel syndrome (IBS)    Thyroid disease     Past Surgical History: Past Surgical History:  Procedure Laterality Date   CHOLECYSTECTOMY     COLONOSCOPY      Allergies: Allergies as of 01/11/2023 - Review Complete 01/11/2023  Allergen Reaction Noted   Levaquin [levofloxacin in d5w] Other (See Comments) 08/12/2012    Medications: Current Meds  Medication Sig   gabapentin (NEURONTIN) 300 MG capsule 1 po q hs x 3 days, then 1 po bid.   levothyroxine (SYNTHROID, LEVOTHROID) 150 MCG tablet Take 150 mcg by mouth daily.   methocarbamol (ROBAXIN) 500 MG tablet Take 1 tablet (500 mg total) by mouth every 8 (eight) hours as needed for muscle spasms. This can make you sleepy.   naproxen (EC-NAPROXEN) 500 MG EC tablet Take 1 tablet (500 mg total) by mouth 2 (two) times daily with a meal.   pantoprazole (PROTONIX) 40 MG tablet TAKE 1 TABLET BY MOUTH EVERY DAY   sertraline (ZOLOFT) 50 MG tablet Take 50 mg   by mouth daily.    Social History: Social History   Tobacco Use   Smoking status: Never   Smokeless tobacco: Never  Vaping Use   Vaping Use: Never used  Substance Use Topics   Alcohol use: Yes    Comment: occasional wine   Drug use: No    Family Medical History: Family History  Problem Relation Age of Onset   Prostate cancer Father    Colon polyps Father    Heart disease Maternal Grandfather    Heart disease Paternal Grandmother    Heart disease Paternal Grandfather    Colon cancer Neg Hx    Esophageal cancer Neg Hx    Rectal cancer Neg Hx    Stomach cancer Neg Hx     Physical  Examination: Vitals:   01/11/23 1101  BP: 124/77  Pulse: 95  SpO2: 98%    General: Patient is well developed, well nourished, calm, collected, and in no apparent distress. Attention to examination is appropriate.  Neck:   Supple.  Full range of motion.  Respiratory: Patient is breathing without any difficulty.   NEUROLOGICAL:     Awake, alert, oriented to person, place, and time.  Speech is clear and fluent.   Cranial Nerves: Pupils equal round and reactive to light.  Facial tone is symmetric.  Facial sensation is symmetric. Shoulder shrug is symmetric. Tongue protrusion is midline.  There is no pronator drift.  ROM of spine: full.    Strength: Side Biceps Triceps Deltoid Interossei Grip Wrist Ext. Wrist Flex.  R 5 5 5 5 5 5 5  L 5 5 5 5 5 5 5   Side Iliopsoas Quads Hamstring PF DF EHL  R 5 5 5 5 5 5  L 5 5 5 5 4- 5   Reflexes are 2+ and symmetric at the biceps, triceps, brachioradialis, patella and achilles except L achilles, which is 1+.   Hoffman's is absent.   Bilateral upper and lower extremity sensation is intact to light touch.  She does report numbness on the back of her thigh.    No evidence of dysmetria noted.  Gait is antalgic.  Positive straight leg raise at 30 degrees on the left.     Medical Decision Making  Imaging: MRI L spine 01/07/2023 IMPRESSION: 1. 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 This finding is likely symptomatic and contributing to the patient's current symptoms. 2. No other significant spinal stenosis or nerve root encroachment in the lumbar spine. 3. 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 This is incompletely visualized by this examination, but could be symptomatic based on prior CT appearance. If this has not been evaluated  elsewhere, recommend further evaluation with dedicated thoracic MRI without and with contrast recommended. 4. These results will be called to the ordering clinician or representative by the Radiologist Assistant, and communication documented in the PACS or [facility].     Electronically Signed   By: William  Veazey M.D.   On: 01/07/2023 12:55  I have personally reviewed the images and agree with the above interpretation.  Assessment and Plan: Ms. Seifert is a pleasant 46 y.o. 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  Her symptoms are primarily left-sided.    Due to her objective weakness and worsening condition while on physical therapy, I do not think that further conservative management is indicated.  I recommended surgical intervention.  I have recommended a left-sided L5-S1 microdiscectomy.  I discussed the planned procedure at length with the patient, including the risks, benefits, alternatives, and indications. The risks discussed include but are not limited to bleeding, infection, need for reoperation, spinal fluid leak, stroke, vision loss, anesthetic complication, coma, paralysis, and even death. I also described in detail that improvement was not guaranteed.  The patient expressed understanding of these risks, and asked that we proceed with surgery. I described the surgery in layman's terms, and gave ample opportunity for questions, which were answered to the best of my ability.  I spent a total of 30 minutes in this patient's care today. This time was spent reviewing pertinent records including imaging studies, obtaining and confirming history, performing a directed evaluation, formulating and discussing my recommendations, and documenting the visit within the medical record.      Thank you for involving me in the care of this patient.      Jennessy Sandridge K. Zaneta Lightcap MD,  [facility] Neurosurgery  

## 2023-01-10 NOTE — Progress Notes (Signed)
Referring Physician:  [facility] [place],  [place] [address]  Primary Physician:  [facility]  History of Present Illness: 01/10/2023 Anna Taylor has a history of thyroid disease.   Seen in ED on 11/27/22 with LBP that started on 11/26/22 after picking up a case of water. Pain is radiating down the leg into the thigh, unable to sleep.   She has constant left sided LBP with left posterior leg pain to her foot. No specific aggravating/alleviating factors. No right leg pain. Having trouble sleeping at night due to pain. She has been out of work due to pain.   She has done a few visits of PT and it has not helped.   She is on neurontin, robaxin, and naproxen with minimal relief.    Bowel/Bladder Dysfunction: none  Conservative measures:  Physical therapy: has done a few visits at [facility] therapy including regular antiinflammatories: lidoderm patches, naproxen, prednisone , neurontin Injections: No epidural steroid injections   Past Surgery: no spinal surgery  Anna Taylor has no symptoms of cervical myelopathy.  The symptoms are causing a significant impact on the patient's life.   Review of Systems:  A 10 point review of systems is negative, except for the pertinent positives and negatives detailed in the HPI.  Past Medical History: Past Medical History:  Diagnosis Date   Anxiety    Depression    GERD (gastroesophageal reflux disease)    Irritable bowel syndrome (IBS)    Thyroid disease     Past Surgical History: Past Surgical History:  Procedure Laterality Date   CHOLECYSTECTOMY     COLONOSCOPY      Allergies: Allergies as of 01/10/2023 - Review Complete 01/10/2023  Allergen Reaction Noted   Levaquin [levofloxacin in d5w] Other (See Comments) 08/12/2012    Medications: Outpatient Encounter Medications as of 01/10/2023  Medication Sig   gabapentin (NEURONTIN) 300  MG capsule 1 po q hs x 3 days, then 1 po bid.   levothyroxine (SYNTHROID, LEVOTHROID) 150 MCG tablet Take 150 mcg by mouth daily.   methocarbamol (ROBAXIN) 500 MG tablet Take 1 tablet (500 mg total) by mouth every 8 (eight) hours as needed for muscle spasms. This can make you sleepy.   naproxen (EC-NAPROXEN) 500 MG EC tablet Take 1 tablet (500 mg total) by mouth 2 (two) times daily with a meal.   pantoprazole (PROTONIX) 40 MG tablet TAKE 1 TABLET BY MOUTH EVERY DAY   sertraline (ZOLOFT) 50 MG tablet Take 50 mg by mouth daily.   No facility-administered encounter medications on file as of 01/10/2023.    Social History: Social History   Tobacco Use   Smoking status: Never   Smokeless tobacco: Never  Vaping Use   Vaping Use: Never used  Substance Use Topics   Alcohol use: Yes    Comment: occasional wine   Drug use: No    Family Medical History: Family History  Problem Relation Age of Onset   Prostate cancer Father    Colon polyps Father    Heart disease Maternal Grandfather    Heart disease Paternal Grandmother    Heart disease Paternal Grandfather    Colon cancer Neg Hx    Esophageal cancer Neg Hx    Rectal cancer Neg Hx    Stomach cancer Neg Hx     Physical Examination: Vitals:   01/10/23 1135  BP: 115/76  Pulse: 95  SpO2: 98%  Awake, alert, oriented to person, place, and time.  Speech is clear and fluent. Fund of knowledge is appropriate.   Cranial Nerves: Pupils equal round and reactive to light.  Facial tone is symmetric.    Limited ROM of lumbar spine wtih pain Mild left sided posterior lumbar tenderness.   No abnormal lesions on exposed skin.   Strength: Side Biceps Triceps Deltoid Interossei Grip Wrist Ext. Wrist Flex.  R 5 5 5 5 5 5 5   L 5 5 5 5 5 5 5    Side Iliopsoas Quads Hamstring PF DF EHL  R 5 5 5 5 5 5   L 5 5 5 4 5 5    She has weakness in left foot- she cannot do toe stand on left. She is able to do this on right side.  Reflexes are 2+  and symmetric at the biceps, triceps, brachioradialis, patella and achilles.   Hoffman's is absent.  Clonus is not present.   Bilateral upper and lower extremity sensation is intact to light touch.     Gait is normal.    Medical Decision Making  Imaging: Lumbar MRI dated 01/03/23:  FINDINGS: Segmentation: Conventional anatomy assumed, with the last open disc space designated L5-S1.Concordant with prior imaging.   Alignment:  Physiologic.   Vertebrae: Scattered hemangiomas or fatty deposits, largest on the left at T12. 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 This is associated with chronic osseous erosion and extended superiorly to T10 on the prior CT. On prior CT, this was associated with expansion of the left T10-11 foramen and probable mass effect on the cord, not visualized by this study. Mild sacroiliac degenerative changes bilaterally.   Conus medullaris: Extends to the T12-L1 level and appears normal.   Paraspinal and other soft tissues: No significant paraspinal findings.   Disc levels:   See discussion above regarding the chronic intraspinal cystic lesion at T10-11, not imaged in the axial plane on this examination.   T12-L1: Normal interspace.   L1-2: Mild disc bulging with a tiny left paracentral disc protrusion. No spinal stenosis or nerve root encroachment.   L2-3: Normal interspace.   L3-4: Mild disc desiccation and bulging. No spinal stenosis or nerve root encroachment. Mild facet hypertrophy.   L4-5: Mild disc desiccation and bulging. Mild facet hypertrophy. No significant spinal stenosis or nerve root encroachment.   L5-S1: There is a large central disc extrusion which demonstrates cephalad extension behind the L5 vertebral body. There is mass effect on the thecal sac and both S1 nerve roots which are posteriorly displaced. No  exiting L5 nerve root encroachment identified. Underlying mild bilateral facet hypertrophy.   IMPRESSION: 1. 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 This finding is likely symptomatic and contributing to the patient's current symptoms. 2. No other significant spinal stenosis or nerve root encroachment in the lumbar spine. 3. 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 This is incompletely visualized by this examination, but could be symptomatic based on prior CT appearance. If this has not been evaluated elsewhere, recommend further evaluation with dedicated thoracic MRI without and with contrast recommended. 4. These results will be called to the ordering clinician or representative by the Radiologist Assistant, and communication  documented in the PACS or [employer].     Electronically Signed   By: Richardean Sale M.D.   On: 01/07/2023 12:55  I have personally reviewed the images and agree with the above interpretation.   Assessment and Plan: Anna Taylor is a pleasant 47 y.o. female has severe constant LBP and left posterior leg pain to her foot.  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   Treatment options discussed with patient and following plan made:   - Recommend she see Dr. Izora Ribas to discuss surgery options for large HNP at L5-S1. She has not improved with time, PT, or medications. She now has developed weakness in left leg as well.  - She has appointment with Dr. Izora Ribas tomorrow.  - Reviewed possible thoracic cyst seen on above MRI. Recommend  dedicated thoracic MRI with and without contrast for further evaluation. This was ordered.   I spent a total of 25 minutes in face-to-face and non-face-to-face activities related to this patient's care today including review of outside records, review of imaging, review of symptoms, physical exam, discussion of differential diagnosis, discussion of treatment options, and documentation.   Thank you for involving me in the care of this patient.   Geronimo Boot PA-C Dept. of Neurosurgery

## 2023-01-10 NOTE — Progress Notes (Unsigned)
Referring Physician:  [facility] [place],  [place] [address]  Primary Physician:  [facility]  History of Present Illness: 01/11/2023 [name] is here today with a chief complaint of .low back pain that radiates into the left posterior thigh and down towards her heel.  She began having left leg pain approximately 7 weeks ago after she picked up a case of water on February 11.  She now has severe pain that is been worsening.  She has trouble going about her day-to-day life.  She has pain with standing.  Nothing really helps.  Her pain is as bad as 10 out of 10.    She has done some physical therapy without improvement.  She noted weakness recently, and comes in today for reevaluation.    The symptoms are causing a significant impact on the patient's life.   Progress Note from Geronimo Boot, [place] on 01/10/23: [name] has a history of thyroid disease.    Seen in ED on 11/27/22 with LBP that started on 11/26/22 after picking up a case of water. Pain is radiating down the leg into the thigh, unable to sleep.    She has constant left sided LBP with left posterior leg pain to her foot. No specific aggravating/alleviating factors. No right leg pain. Having trouble sleeping at night due to pain. She has been out of work due to pain.    She has done a few visits of PT and it has not helped.    She is on neurontin, robaxin, and naproxen with minimal relief.     Bowel/Bladder Dysfunction: none   Conservative measures:  Physical therapy: has done a few visits at [facility] therapy including regular antiinflammatories: lidoderm patches, naproxen, prednisone , neurontin Injections: No epidural steroid injections    Past Surgery: no spinal surgery   Kiala P Graven has no symptoms of cervical myelopathy.   The symptoms are causing a significant impact on the patient's life.   I have  utilized the care everywhere function in epic to review the outside records available from external health systems.  Review of Systems:  A 10 point review of systems is negative, except for the pertinent positives and negatives detailed in the HPI.  Past Medical History: Past Medical History:  Diagnosis Date   Anxiety    Depression    GERD (gastroesophageal reflux disease)    Irritable bowel syndrome (IBS)    Thyroid disease     Past Surgical History: Past Surgical History:  Procedure Laterality Date   CHOLECYSTECTOMY     COLONOSCOPY      Allergies: Allergies as of 01/11/2023 - Review Complete 01/11/2023  Allergen Reaction Noted   Levaquin [levofloxacin in d5w] Other (See Comments) 08/12/2012    Medications: Current Meds  Medication Sig   gabapentin (NEURONTIN) 300 MG capsule 1 po q hs x 3 days, then 1 po bid.   levothyroxine (SYNTHROID, LEVOTHROID) 150 MCG tablet Take 150 mcg by mouth daily.   methocarbamol (ROBAXIN) 500 MG tablet Take 1 tablet (500 mg total) by mouth every 8 (eight) hours as needed for muscle spasms. This can make you sleepy.   naproxen (EC-NAPROXEN) 500 MG EC tablet Take 1 tablet (500 mg total) by mouth 2 (two) times daily with a meal.   pantoprazole (PROTONIX) 40 MG tablet TAKE 1 TABLET BY MOUTH EVERY DAY   sertraline (ZOLOFT) 50 MG tablet Take 50 mg  by mouth daily.    Social History: Social History   Tobacco Use   Smoking status: Never   Smokeless tobacco: Never  Vaping Use   Vaping Use: Never used  Substance Use Topics   Alcohol use: Yes    Comment: occasional wine   Drug use: No    Family Medical History: Family History  Problem Relation Age of Onset   Prostate cancer Father    Colon polyps Father    Heart disease Maternal Grandfather    Heart disease Paternal Grandmother    Heart disease Paternal Grandfather    Colon cancer Neg Hx    Esophageal cancer Neg Hx    Rectal cancer Neg Hx    Stomach cancer Neg Hx     Physical  Examination: Vitals:   01/11/23 1101  BP: 124/77  Pulse: 95  SpO2: 98%    General: Patient is well developed, well nourished, calm, collected, and in no apparent distress. Attention to examination is appropriate.  Neck:   Supple.  Full range of motion.  Respiratory: Patient is breathing without any difficulty.   NEUROLOGICAL:     Awake, alert, oriented to person, place, and time.  Speech is clear and fluent.   Cranial Nerves: Pupils equal round and reactive to light.  Facial tone is symmetric.  Facial sensation is symmetric. Shoulder shrug is symmetric. Tongue protrusion is midline.  There is no pronator drift.  ROM of spine: full.    Strength: Side Biceps Triceps Deltoid Interossei Grip Wrist Ext. Wrist Flex.  R 5 5 5 5 5 5 5   L 5 5 5 5 5 5 5    Side Iliopsoas Quads Hamstring PF DF EHL  R 5 5 5 5 5 5   L 5 5 5 5  4- 5   Reflexes are 2+ and symmetric at the biceps, triceps, brachioradialis, patella and achilles except L achilles, which is 1+.   Hoffman's is absent.   Bilateral upper and lower extremity sensation is intact to light touch.  She does report numbness on the back of her thigh.    No evidence of dysmetria noted.  Gait is antalgic.  Positive straight leg raise at 30 degrees on the left.     Medical Decision Making  Imaging: MRI L spine 01/07/2023 IMPRESSION: 1. 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 This finding is likely symptomatic and contributing to the patient's current symptoms. 2. No other significant spinal stenosis or nerve root encroachment in the lumbar spine. 3. 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 This is incompletely visualized by this examination, but could be symptomatic based on prior CT appearance. If this has not been evaluated  elsewhere, recommend further evaluation with dedicated thoracic MRI without and with contrast recommended. 4. These results will be called to the ordering clinician or representative by the Radiologist Assistant, and communication documented in the PACS or [employer].     Electronically Signed   By: Richardean Sale M.D.   On: 01/07/2023 12:55  I have personally reviewed the images and agree with the above interpretation.  Assessment and Plan: Ms. Schilz is a pleasant 47 y.o. 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  Her symptoms are primarily left-sided.  Due to her objective weakness and worsening condition while on physical therapy, I do not think that further conservative management is indicated.  I recommended surgical intervention.  I have recommended a left-sided L5-S1 microdiscectomy.  I discussed the planned procedure at length with the patient, including the risks, benefits, alternatives, and indications. The risks discussed include but are not limited to bleeding, infection, need for reoperation, spinal fluid leak, stroke, vision loss, anesthetic complication, coma, paralysis, and even death. I also described in detail that improvement was not guaranteed.  The patient expressed understanding of these risks, and asked that we proceed with surgery. I described the surgery in layman's terms, and gave ample opportunity for questions, which were answered to the best of my ability.  I spent a total of 30 minutes in this patient's care today. This time was spent reviewing pertinent records including imaging studies, obtaining and confirming history, performing a directed evaluation, formulating and discussing my recommendations, and documenting the visit within the medical record.      Thank you for involving me in the care of this patient.      Denica Web K. Izora Ribas MD,  [facility]

## 2023-01-11 ENCOUNTER — Ambulatory Visit: Payer: BC Managed Care – PPO | Admitting: Neurosurgery

## 2023-01-11 ENCOUNTER — Encounter: Payer: Self-pay | Admitting: Neurosurgery

## 2023-01-11 ENCOUNTER — Other Ambulatory Visit: Payer: Self-pay

## 2023-01-11 VITALS — BP 124/77 | HR 95 | Ht 66.5 in | Wt 230.0 lb

## 2023-01-11 DIAGNOSIS — M5416 Radiculopathy, lumbar region: Secondary | ICD-10-CM

## 2023-01-11 DIAGNOSIS — R29898 Other symptoms and signs involving the musculoskeletal system: Secondary | ICD-10-CM | POA: Diagnosis not present

## 2023-01-11 DIAGNOSIS — Z01818 Encounter for other preprocedural examination: Secondary | ICD-10-CM

## 2023-01-11 NOTE — Patient Instructions (Signed)
Please see below for information in regards to your upcoming surgery:   Planned surgery: Left L5-S1 microdiscectomy   Surgery date: 01/26/23 - you will find out your arrival time the business day before your surgery.   Pre-op appointment at [facility]: we will call you with a date/time for this. Pre-admit testing is located on the first floor of the [facility], [place], [address]. Please bring all prescriptions in the original prescription bottles to your appointment, even if you have reviewed medications by phone with a pharmacy representative. Pre-op labs may be done at your pre-op appointment. You are not required to fast for these labs. Should you need to change your pre-op appointment, please call Pre-admit testing at [phone].    Surgical clearance: we will send a clearance form to [facility]    If you have FMLA/disability paperwork, please drop it off or fax it to [phone], attention [place].   We can be reached by phone or mychart 8am-4pm, Monday-Friday. If you have any questions/concerns before or after surgery, you can reach [place] at [phone], or you can send a mychart message. If you have a concern after hours that cannot wait until normal business hours, you can call [phone] and ask to page the neurosurgeon on call for [place].     Appointments/FMLA & disability paperwork: Ryan  Nurse: Ophelia Shoulder  Medical assistants: Lum Keas Physician Assistant's: [place] Surgeon: Meade Maw, MD

## 2023-01-12 ENCOUNTER — Other Ambulatory Visit: Payer: Self-pay | Admitting: Orthopedic Surgery

## 2023-01-12 DIAGNOSIS — M5442 Lumbago with sciatica, left side: Secondary | ICD-10-CM

## 2023-01-12 DIAGNOSIS — M5416 Radiculopathy, lumbar region: Secondary | ICD-10-CM

## 2023-01-12 NOTE — Telephone Encounter (Signed)
She can be out of work until end of school year on 03/27/23. I did a work note. Not sure if you needed to change anything since we only took her out for 6 weeks previously.

## 2023-01-18 ENCOUNTER — Other Ambulatory Visit: Payer: Self-pay

## 2023-01-18 ENCOUNTER — Telehealth: Payer: Self-pay

## 2023-01-18 ENCOUNTER — Encounter
Admission: RE | Admit: 2023-01-18 | Discharge: 2023-01-18 | Disposition: A | Discharge status: Home / Self Care | Payer: BC Managed Care – PPO | Source: Ambulatory Visit | Attending: Neurosurgery | Admitting: Neurosurgery

## 2023-01-18 VITALS — BP 126/87 | HR 75 | Resp 16 | Ht 66.5 in | Wt 235.0 lb

## 2023-01-18 DIAGNOSIS — Z01812 Encounter for preprocedural laboratory examination: Secondary | ICD-10-CM

## 2023-01-18 DIAGNOSIS — Z01818 Encounter for other preprocedural examination: Secondary | ICD-10-CM | POA: Insufficient documentation

## 2023-01-18 HISTORY — DX: Hypothyroidism, unspecified: E03.9

## 2023-01-18 HISTORY — DX: Myoneural disorder, unspecified: G70.9

## 2023-01-18 HISTORY — DX: Unspecified osteoarthritis, unspecified site: M19.90

## 2023-01-18 LAB — BASIC METABOLIC PANEL WITH GFR
Anion gap: 9 (ref 5–15)
BUN: 9 mg/dL (ref 6–20)
CO2: 24 mmol/L (ref 22–32)
Calcium: 8.8 mg/dL — ABNORMAL LOW (ref 8.9–10.3)
Chloride: 105 mmol/L (ref 98–111)
Creatinine, Ser: 0.81 mg/dL (ref 0.44–1.00)
GFR, Estimated: >60 mL/min
Glucose, Bld: 113 mg/dL — ABNORMAL HIGH (ref 70–99)
Potassium: 3.3 mmol/L — ABNORMAL LOW (ref 3.5–5.1)
Sodium: 138 mmol/L (ref 135–145)

## 2023-01-18 LAB — URINALYSIS, ROUTINE W REFLEX MICROSCOPIC
Bacteria, UA: NONE SEEN
Bilirubin Urine: NEGATIVE
Glucose, UA: NEGATIVE mg/dL
Hgb urine dipstick: NEGATIVE
Ketones, ur: NEGATIVE mg/dL
Nitrite: NEGATIVE
Protein, ur: 30 mg/dL — AB
Specific Gravity, Urine: 1.023 (ref 1.005–1.030)
Squamous Epithelial / HPF: >50 /HPF (ref 0–5)
WBC, UA: >50 WBC/hpf (ref 0–5)
pH: 5 (ref 5.0–8.0)

## 2023-01-18 LAB — SURGICAL PCR SCREEN
MRSA, PCR: NEGATIVE
Staphylococcus aureus: NEGATIVE

## 2023-01-18 LAB — CBC
HCT: 41.6 % (ref 36.0–46.0)
Hemoglobin: 14.2 g/dL (ref 12.0–15.0)
MCH: 31.6 pg (ref 26.0–34.0)
MCHC: 34.1 g/dL (ref 30.0–36.0)
MCV: 92.7 fL (ref 80.0–100.0)
Platelets: 166 10*3/uL (ref 150–400)
RBC: 4.49 MIL/uL (ref 3.87–5.11)
RDW: 11.9 % (ref 11.5–15.5)
WBC: 5.7 10*3/uL (ref 4.0–10.5)
nRBC: 0 % (ref 0.0–0.2)

## 2023-01-18 LAB — TYPE AND SCREEN
ABO/RH(D): B POS
Antibody Screen: NEGATIVE

## 2023-01-18 NOTE — Patient Instructions (Addendum)
Your procedure is scheduled on: 01/26/23 - Friday Report to the Registration Desk on the 1st floor of the [facility]. To find out your arrival time, please call [phone] between 1PM - 3PM on: 01/25/23 - Thursday If your arrival time is 6:00 am, do not arrive before that time as the [facility] entrance doors do not open until 6:00 am.  REMEMBER: Instructions that are not followed completely may result in serious medical risk, up to and including death; or upon the discretion of your surgeon and anesthesiologist your surgery may need to be rescheduled.  Do not eat food after midnight the night before surgery.  No gum chewing or hard candies.  You may however, drink CLEAR liquids up to 2 hours before you are scheduled to arrive for your surgery. Do not drink anything within 2 hours of your scheduled arrival time.  Clear liquids include: - water  - apple juice without pulp - gatorade (not RED colors) - black coffee or tea (Do NOT add milk or creamers to the coffee or tea) Do NOT drink anything that is not on this list.  One week prior to surgery: Beginning 01/19/23. Stop Anti-inflammatories (NSAIDS) such as Advil, Aleve, Ibuprofen, Motrin, Naproxen, Naprosyn and Aspirin based products such as Excedrin, Goody's Powder, BC Powder.   You may take Tylenol if needed for pain up until the day of surgery.  Stop ANY OVER THE COUNTER supplements until after surgery.  TAKE ONLY THESE MEDICATIONS THE MORNING OF SURGERY WITH A SIP OF WATER:  pantoprazole (PROTONIX - (take one the night before and one on the morning of surgery - helps to prevent nausea after surgery.) gabapentin (NEURONTIN ) sertraline (ZOLOFT)    No Alcohol for 24 hours before or after surgery.  No Smoking including e-cigarettes for 24 hours before surgery.  No chewable tobacco products for at least 6 hours before surgery.  No nicotine patches on the day of surgery.  Do not use any "recreational" drugs for at least  a week (preferably 2 weeks) before your surgery.  Please be advised that the combination of cocaine and anesthesia may have negative outcomes, up to and including death. If you test positive for cocaine, your surgery will be cancelled.  On the morning of surgery brush your teeth with toothpaste and water, you may rinse your mouth with mouthwash if you wish. Do not swallow any toothpaste or mouthwash.  Use CHG Soap or wipes as directed on instruction sheet.  Do not wear jewelry, make-up, hairpins, clips or nail polish.  Do not wear lotions, powders, or perfumes.   Do not shave body hair from the neck down 48 hours before surgery.  Contact lenses, hearing aids and dentures may not be worn into surgery.  Do not bring valuables to the hospital. [facility] is not responsible for any missing/lost belongings or valuables.   Notify your doctor if there is any change in your medical condition (cold, fever, infection).  Wear comfortable clothing (specific to your surgery type) to the hospital.  After surgery, you can help prevent lung complications by doing breathing exercises.  Take deep breaths and cough every 1-2 hours. Your doctor may order a device called an Incentive Spirometer to help you take deep breaths. When coughing or sneezing, hold a pillow firmly against your incision with both hands. This is called "splinting." Doing this helps protect your incision. It also decreases belly discomfort.  If you are being admitted to the hospital overnight, leave your suitcase in  the car. After surgery it may be brought to your room.  In case of increased patient census, it may be necessary for you, the patient, to continue your postoperative care in the Same Day Surgery department.  If you are being discharged the day of surgery, you will not be allowed to drive home. You will need a responsible individual to drive you home and stay with you for 24 hours after surgery.   If you are taking  public transportation, you will need to have a responsible individual with you.  Please call the [facility] Dept. at [phone] if you have any questions about these instructions.  Surgery Visitation Policy:  Patients having surgery or a procedure may have two visitors.  Children under the age of 67 must have an adult with them who is not the patient.  Inpatient Visitation:    Visiting hours are 7 a.m. to 8 p.m. Up to four visitors are allowed at one time in a patient room. The visitors may rotate out with other people during the day.  One visitor age 38 or older may stay with the patient overnight and must be in the room by 8 p.m.

## 2023-01-18 NOTE — Telephone Encounter (Signed)
[facility] urgent care checking the status of Pre op clearance , LVM with Kendra at [facility], she advised that the patient needed to be seen before the can fill out pre op clearance form.    Lvm with patient advised of the above information

## 2023-01-19 LAB — URINE CULTURE: Culture: <10000 — AB

## 2023-01-23 NOTE — Telephone Encounter (Signed)
I just spoke to the patient. She is not sure why she needs surgical clearance she is over all healthy. She will call her PCP to schedule appt.

## 2023-01-23 NOTE — Telephone Encounter (Signed)
Notified Dr Myer Haff

## 2023-01-26 ENCOUNTER — Ambulatory Visit: Payer: BC Managed Care – PPO

## 2023-01-26 ENCOUNTER — Encounter
Admission: RE | Disposition: A | Discharge status: Home / Self Care | Payer: Self-pay | Source: Ambulatory Visit | Attending: Neurosurgery

## 2023-01-26 ENCOUNTER — Encounter: Payer: Self-pay | Admitting: Neurosurgery

## 2023-01-26 ENCOUNTER — Ambulatory Visit
Admission: RE | Admit: 2023-01-26 | Discharge: 2023-01-26 | Disposition: A | Discharge status: Home / Self Care | Payer: BC Managed Care – PPO | Source: Ambulatory Visit | Attending: Neurosurgery | Admitting: Neurosurgery

## 2023-01-26 ENCOUNTER — Ambulatory Visit: Payer: BC Managed Care – PPO | Admitting: Urgent Care

## 2023-01-26 ENCOUNTER — Other Ambulatory Visit: Payer: Self-pay

## 2023-01-26 DIAGNOSIS — F419 Anxiety disorder, unspecified: Secondary | ICD-10-CM | POA: Insufficient documentation

## 2023-01-26 DIAGNOSIS — M51369 Other intervertebral disc degeneration, lumbar region without mention of lumbar back pain or lower extremity pain: Secondary | ICD-10-CM

## 2023-01-26 DIAGNOSIS — M5117 Intervertebral disc disorders with radiculopathy, lumbosacral region: Secondary | ICD-10-CM | POA: Diagnosis present

## 2023-01-26 DIAGNOSIS — Z01812 Encounter for preprocedural laboratory examination: Secondary | ICD-10-CM

## 2023-01-26 DIAGNOSIS — M5416 Radiculopathy, lumbar region: Secondary | ICD-10-CM | POA: Diagnosis not present

## 2023-01-26 DIAGNOSIS — Z6837 Body mass index (BMI) 37.0-37.9, adult: Secondary | ICD-10-CM | POA: Diagnosis not present

## 2023-01-26 DIAGNOSIS — R29898 Other symptoms and signs involving the musculoskeletal system: Secondary | ICD-10-CM

## 2023-01-26 DIAGNOSIS — Z79899 Other long term (current) drug therapy: Secondary | ICD-10-CM | POA: Diagnosis not present

## 2023-01-26 DIAGNOSIS — Z01818 Encounter for other preprocedural examination: Secondary | ICD-10-CM

## 2023-01-26 DIAGNOSIS — E039 Hypothyroidism, unspecified: Secondary | ICD-10-CM | POA: Insufficient documentation

## 2023-01-26 DIAGNOSIS — K219 Gastro-esophageal reflux disease without esophagitis: Secondary | ICD-10-CM | POA: Insufficient documentation

## 2023-01-26 DIAGNOSIS — R8290 Unspecified abnormal findings in urine: Secondary | ICD-10-CM

## 2023-01-26 HISTORY — PX: LUMBAR LAMINECTOMY/DECOMPRESSION MICRODISCECTOMY: SHX5026

## 2023-01-26 LAB — ABO/RH: ABO/RH(D): B POS

## 2023-01-26 LAB — POCT PREGNANCY, URINE: Preg Test, Ur: NEGATIVE

## 2023-01-26 SURGERY — LUMBAR LAMINECTOMY/DECOMPRESSION MICRODISCECTOMY 1 LEVEL
Anesthesia: General | Site: Spine Lumbar | Laterality: Left

## 2023-01-26 MED ORDER — SODIUM CHLORIDE FLUSH 0.9 % IV SOLN
INTRAVENOUS | Status: AC
  Filled 2023-01-26: qty 20

## 2023-01-26 MED ORDER — SENNA 8.6 MG PO TABS: 1.0000 | ORAL_TABLET | Freq: Every day | ORAL | 0 refills | Status: DC | PRN

## 2023-01-26 MED ORDER — CHLORHEXIDINE GLUCONATE 0.12 % MT SOLN
15.0000 mL | Freq: Once | OROMUCOSAL | Status: AC
  Administered 2023-01-26: 15 mL via OROMUCOSAL

## 2023-01-26 MED ORDER — ONDANSETRON HCL 4 MG/2ML IJ SOLN: 4.0000 mg | Freq: Once | INTRAMUSCULAR | Status: DC | PRN

## 2023-01-26 MED ORDER — SUCCINYLCHOLINE CHLORIDE 200 MG/10ML IV SOSY
PREFILLED_SYRINGE | INTRAVENOUS | Status: DC | PRN
  Administered 2023-01-26: 100 mg via INTRAVENOUS

## 2023-01-26 MED ORDER — FENTANYL CITRATE (PF) 100 MCG/2ML IJ SOLN
INTRAMUSCULAR | Status: DC | PRN
  Administered 2023-01-26 (×2): 50 ug via INTRAVENOUS

## 2023-01-26 MED ORDER — OXYCODONE HCL 5 MG PO TABS
5.0000 mg | ORAL_TABLET | Freq: Once | ORAL | Status: AC
  Administered 2023-01-26: 5 mg via ORAL

## 2023-01-26 MED ORDER — CEFAZOLIN SODIUM-DEXTROSE 2-4 GM/100ML-% IV SOLN
INTRAVENOUS | Status: AC
  Filled 2023-01-26: qty 100

## 2023-01-26 MED ORDER — BUPIVACAINE-EPINEPHRINE (PF) 0.5% -1:200000 IJ SOLN
INTRAMUSCULAR | Status: AC
  Filled 2023-01-26: qty 30

## 2023-01-26 MED ORDER — BUPIVACAINE LIPOSOME 1.3 % IJ SUSP
INTRAMUSCULAR | Status: AC
  Filled 2023-01-26: qty 20

## 2023-01-26 MED ORDER — DEXAMETHASONE SODIUM PHOSPHATE 10 MG/ML IJ SOLN
INTRAMUSCULAR | Status: DC | PRN
  Administered 2023-01-26: 10 mg via INTRAVENOUS

## 2023-01-26 MED ORDER — PROPOFOL 10 MG/ML IV BOLUS
INTRAVENOUS | Status: AC
  Filled 2023-01-26: qty 20

## 2023-01-26 MED ORDER — HYDROMORPHONE HCL 1 MG/ML IJ SOLN
INTRAMUSCULAR | Status: DC | PRN
  Administered 2023-01-26: 1 mg via INTRAVENOUS

## 2023-01-26 MED ORDER — ACETAMINOPHEN 10 MG/ML IV SOLN
INTRAVENOUS | Status: DC | PRN
  Administered 2023-01-26: 1000 mg via INTRAVENOUS

## 2023-01-26 MED ORDER — 0.9 % SODIUM CHLORIDE (POUR BTL) OPTIME
TOPICAL | Status: DC | PRN
  Administered 2023-01-26: 250 mL

## 2023-01-26 MED ORDER — FENTANYL CITRATE (PF) 100 MCG/2ML IJ SOLN
INTRAMUSCULAR | Status: AC
  Filled 2023-01-26: qty 2

## 2023-01-26 MED ORDER — CEFAZOLIN SODIUM-DEXTROSE 2-4 GM/100ML-% IV SOLN
2.0000 g | INTRAVENOUS | Status: AC
  Administered 2023-01-26: 2 g via INTRAVENOUS

## 2023-01-26 MED ORDER — BUPIVACAINE-EPINEPHRINE (PF) 0.5% -1:200000 IJ SOLN
INTRAMUSCULAR | Status: DC | PRN
  Administered 2023-01-26: 5 mL via PERINEURAL

## 2023-01-26 MED ORDER — STERILE WATER FOR IRRIGATION IR SOLN
Status: DC | PRN
  Administered 2023-01-26: 500 mL

## 2023-01-26 MED ORDER — REMIFENTANIL HCL 1 MG IV SOLR
INTRAVENOUS | Status: AC
  Filled 2023-01-26: qty 1000

## 2023-01-26 MED ORDER — MIDAZOLAM HCL 2 MG/2ML IJ SOLN
INTRAMUSCULAR | Status: AC
  Filled 2023-01-26: qty 2

## 2023-01-26 MED ORDER — REMIFENTANIL HCL 1 MG IV SOLR
INTRAVENOUS | Status: DC | PRN
  Administered 2023-01-26: .1 ug/kg/min via INTRAVENOUS

## 2023-01-26 MED ORDER — GLYCOPYRROLATE 0.2 MG/ML IJ SOLN
INTRAMUSCULAR | Status: DC | PRN
  Administered 2023-01-26: .2 mg via INTRAVENOUS

## 2023-01-26 MED ORDER — OXYCODONE HCL 5 MG PO TABS: 5.0000 mg | ORAL_TABLET | ORAL | 0 refills | Status: DC | PRN

## 2023-01-26 MED ORDER — ACETAMINOPHEN 10 MG/ML IV SOLN
INTRAVENOUS | Status: AC
  Filled 2023-01-26: qty 100

## 2023-01-26 MED ORDER — SCOPOLAMINE 1 MG/3DAYS TD PT72
MEDICATED_PATCH | TRANSDERMAL | Status: AC
  Filled 2023-01-26: qty 1

## 2023-01-26 MED ORDER — CHLORHEXIDINE GLUCONATE 0.12 % MT SOLN
OROMUCOSAL | Status: AC
  Filled 2023-01-26: qty 15

## 2023-01-26 MED ORDER — DEXMEDETOMIDINE HCL IN NACL 200 MCG/50ML IV SOLN
INTRAVENOUS | Status: DC | PRN
  Administered 2023-01-26: 4 ug via INTRAVENOUS

## 2023-01-26 MED ORDER — METHYLPREDNISOLONE ACETATE 40 MG/ML IJ SUSP
INTRAMUSCULAR | Status: AC
  Filled 2023-01-26: qty 1

## 2023-01-26 MED ORDER — CEFAZOLIN IN SODIUM CHLORIDE 2-0.9 GM/100ML-% IV SOLN
2.0000 g | Freq: Once | INTRAVENOUS | Status: DC
  Filled 2023-01-26: qty 100

## 2023-01-26 MED ORDER — LIDOCAINE HCL (CARDIAC) PF 100 MG/5ML IV SOSY
PREFILLED_SYRINGE | INTRAVENOUS | Status: DC | PRN
  Administered 2023-01-26: 100 mg via INTRAVENOUS

## 2023-01-26 MED ORDER — SURGIFLO WITH THROMBIN (HEMOSTATIC MATRIX KIT) OPTIME
TOPICAL | Status: DC | PRN
  Administered 2023-01-26: 1 via TOPICAL

## 2023-01-26 MED ORDER — SODIUM CHLORIDE (PF) 0.9 % IJ SOLN: INTRAMUSCULAR | Status: DC | PRN

## 2023-01-26 MED ORDER — HYDROMORPHONE HCL 1 MG/ML IJ SOLN
INTRAMUSCULAR | Status: AC
  Filled 2023-01-26: qty 1

## 2023-01-26 MED ORDER — ONDANSETRON HCL 4 MG/2ML IJ SOLN
INTRAMUSCULAR | Status: DC | PRN
  Administered 2023-01-26 (×2): 4 mg via INTRAVENOUS

## 2023-01-26 MED ORDER — OXYCODONE HCL 5 MG PO TABS
ORAL_TABLET | ORAL | Status: AC
  Filled 2023-01-26: qty 1

## 2023-01-26 MED ORDER — EPHEDRINE SULFATE (PRESSORS) 50 MG/ML IJ SOLN
INTRAMUSCULAR | Status: DC | PRN
  Administered 2023-01-26: 5 mg via INTRAVENOUS

## 2023-01-26 MED ORDER — SODIUM CHLORIDE (PF) 0.9 % IJ SOLN
INTRAMUSCULAR | Status: DC | PRN
  Administered 2023-01-26: 60 mL via INTRAMUSCULAR

## 2023-01-26 MED ORDER — PROPOFOL 10 MG/ML IV BOLUS
INTRAVENOUS | Status: DC | PRN
  Administered 2023-01-26 (×2): 50 mg via INTRAVENOUS
  Administered 2023-01-26: 200 mg via INTRAVENOUS

## 2023-01-26 MED ORDER — ORAL CARE MOUTH RINSE: 15.0000 mL | Freq: Once | OROMUCOSAL | Status: AC

## 2023-01-26 MED ORDER — MIDAZOLAM HCL 2 MG/2ML IJ SOLN
INTRAMUSCULAR | Status: DC | PRN
  Administered 2023-01-26: 2 mg via INTRAVENOUS

## 2023-01-26 MED ORDER — FENTANYL CITRATE (PF) 100 MCG/2ML IJ SOLN: 25.0000 ug | INTRAMUSCULAR | Status: DC | PRN

## 2023-01-26 MED ORDER — BUPIVACAINE HCL (PF) 0.5 % IJ SOLN
INTRAMUSCULAR | Status: AC
  Filled 2023-01-26: qty 30

## 2023-01-26 MED ORDER — LACTATED RINGERS IV SOLN: INTRAVENOUS | Status: DC

## 2023-01-26 MED ORDER — SEVOFLURANE IN SOLN
RESPIRATORY_TRACT | Status: AC
  Filled 2023-01-26: qty 250

## 2023-01-26 MED ORDER — PHENYLEPHRINE 80 MCG/ML (10ML) SYRINGE FOR IV PUSH (FOR BLOOD PRESSURE SUPPORT)
PREFILLED_SYRINGE | INTRAVENOUS | Status: DC | PRN
  Administered 2023-01-26 (×3): 160 ug via INTRAVENOUS

## 2023-01-26 SURGICAL SUPPLY — 49 items
ADH SKN CLS APL DERMABOND .7 (GAUZE/BANDAGES/DRESSINGS) ×1
AGENT HMST KT MTR STRL THRMB (HEMOSTASIS) ×1
APL PRP STRL LF DISP 70% ISPRP (MISCELLANEOUS)
BASIN KIT SINGLE STR (MISCELLANEOUS) ×1 IMPLANT
BUR NEURO DRILL SOFT 3.0X3.8M (BURR) ×1 IMPLANT
CHLORAPREP W/TINT 26 (MISCELLANEOUS) ×1 IMPLANT
CNTNR URN SCR LID CUP LEK RST (MISCELLANEOUS) ×1 IMPLANT
CONT SPEC 4OZ STRL OR WHT (MISCELLANEOUS) ×1
DERMABOND ADVANCED .7 DNX12 (GAUZE/BANDAGES/DRESSINGS) ×1 IMPLANT
DRAPE C ARM PK CFD 31 SPINE (DRAPES) ×1 IMPLANT
DRAPE LAPAROTOMY 100X77 ABD (DRAPES) ×1 IMPLANT
DRAPE MICROSCOPE SPINE 48X150 (DRAPES) ×1 IMPLANT
DRAPE SURG 17X11 SM STRL (DRAPES) ×1 IMPLANT
DRSG OPSITE POSTOP 3X4 (GAUZE/BANDAGES/DRESSINGS) IMPLANT
ELECT EZSTD 165MM 6.5IN (MISCELLANEOUS)
ELECT REM PT RETURN 9FT ADLT (ELECTROSURGICAL) ×1
ELECTRODE EZSTD 165MM 6.5IN (MISCELLANEOUS) IMPLANT
ELECTRODE REM PT RTRN 9FT ADLT (ELECTROSURGICAL) ×1 IMPLANT
GLOVE BIOGEL PI IND STRL 6.5 (GLOVE) ×1 IMPLANT
GLOVE BIOGEL PI IND STRL 8.5 (GLOVE) ×2 IMPLANT
GLOVE SURG SYN 6.5 ES PF (GLOVE) ×2 IMPLANT
GLOVE SURG SYN 6.5 PF PI (GLOVE) ×2 IMPLANT
GLOVE SURG SYN 8.5  E (GLOVE) ×3
GLOVE SURG SYN 8.5 E (GLOVE) ×3 IMPLANT
GLOVE SURG SYN 8.5 PF PI (GLOVE) ×3 IMPLANT
GOWN SRG LRG LVL 4 IMPRV REINF (GOWNS) ×1 IMPLANT
GOWN SRG XL LVL 3 NONREINFORCE (GOWNS) ×1 IMPLANT
GOWN STRL NON-REIN TWL XL LVL3 (GOWNS) ×1
GOWN STRL REIN LRG LVL4 (GOWNS) ×1
IV NS 500ML (IV SOLUTION) ×1
IV NS 500ML BAXH (IV SOLUTION) IMPLANT
KIT SPINAL PRONEVIEW (KITS) ×1 IMPLANT
MANIFOLD NEPTUNE II (INSTRUMENTS) ×1 IMPLANT
MARKER SKIN DUAL TIP RULER LAB (MISCELLANEOUS) ×1 IMPLANT
NDL SAFETY ECLIP 18X1.5 (MISCELLANEOUS) ×1 IMPLANT
NS IRRIG 1000ML POUR BTL (IV SOLUTION) ×1 IMPLANT
PACK LAMINECTOMY NEURO (CUSTOM PROCEDURE TRAY) ×1 IMPLANT
PAD ARMBOARD 7.5X6 YLW CONV (MISCELLANEOUS) ×1 IMPLANT
SURGIFLO W/THROMBIN 8M KIT (HEMOSTASIS) ×1 IMPLANT
SUT DVC VLOC 3-0 CL 6 P-12 (SUTURE) ×1 IMPLANT
SUT VIC AB 0 CT1 27 (SUTURE) ×1
SUT VIC AB 0 CT1 27XCR 8 STRN (SUTURE) ×1 IMPLANT
SUT VIC AB 2-0 CT1 18 (SUTURE) ×1 IMPLANT
SYR 10ML LL (SYRINGE) ×2 IMPLANT
SYR 30ML LL (SYRINGE) ×2 IMPLANT
SYR 3ML LL SCALE MARK (SYRINGE) ×1 IMPLANT
TRAP FLUID SMOKE EVACUATOR (MISCELLANEOUS) ×1 IMPLANT
WATER STERILE IRR 1000ML POUR (IV SOLUTION) ×2 IMPLANT
WATER STERILE IRR 500ML POUR (IV SOLUTION) IMPLANT

## 2023-01-26 NOTE — Discharge Instructions (Addendum)
Your surgeon has performed an operation on your lumbar spine (low back) to relieve pressure on one or more nerves. Many times, patients feel better immediately after surgery and can "overdo it." Even if you feel well, it is important that you follow these activity guidelines. If you do not let your back heal properly from the surgery, you can increase the chance of a disc herniation and/or return of your symptoms. The following are instructions to help in your recovery once you have been discharged from the hospital.  * It is ok to take NSAIDs after surgery.  Activity    No bending, lifting, or twisting ("BLT"). Avoid lifting objects heavier than 10 pounds (gallon milk jug).  Where possible, avoid household activities that involve lifting, bending, pushing, or pulling such as laundry, vacuuming, grocery shopping, and childcare. Try to arrange for help from friends and family for these activities while your back heals.  Increase physical activity slowly as tolerated.  Taking short walks is encouraged, but avoid strenuous exercise. Do not jog, run, bicycle, lift weights, or participate in any other exercises unless specifically allowed by your doctor. Avoid prolonged sitting, including car rides.  Talk to your doctor before resuming sexual activity.  You should not drive until cleared by your doctor.  Until released by your doctor, you should not return to work or school.  You should rest at home and let your body heal.   You may shower three days after your surgery.  After showering, lightly dab your incision dry. Do not take a tub bath or go swimming for 3 weeks, or until approved by your doctor at your follow-up appointment.  If you smoke, we strongly recommend that you quit.  Smoking has been proven to interfere with normal healing in your back and will dramatically reduce the success rate of your surgery. Please contact [facility] (800-QUIT-NOW) and use the resources at www.[facility].com for  assistance in stopping smoking.  Surgical Incision   If you have a dressing on your incision, you may remove it three days after your surgery. Keep your incision area clean and dry.  Your incision was closed with Dermabond glue. The glue should begin to peel away within about a week. Diet            You may return to your usual diet. Be sure to stay hydrated.  When to Contact Anna Taylor  Although your surgery and recovery will likely be uneventful, you may have some residual numbness, aches, and pains in your back and/or legs. This is normal and should improve in the next few weeks.  However, should you experience any of the following, contact Anna Taylor immediately: New numbness or weakness Pain that is progressively getting worse, and is not relieved by your pain medications or rest Bleeding, redness, swelling, pain, or drainage from surgical incision Chills or flu-like symptoms Fever greater than 101.0 F (38.3 C) Problems with bowel or bladder functions Difficulty breathing or shortness of breath Warmth, tenderness, or swelling in your calf  Contact Information During office hours (Monday-Friday 9 am to 5 pm), please call your physician at [phone] and ask for Sharlot Gowda After hours and weekends, please call [phone] and speak with the neurosurgeon on call For a life-threatening emergency, call 911    AMBULATORY SURGERY  DISCHARGE INSTRUCTIONS   The drugs that you were given will stay in your system until tomorrow so for the next 24 hours you should not:  Drive an automobile Make any legal decisions Drink any  alcoholic beverage   You may resume regular meals tomorrow.  Today it is better to start with liquids and gradually work up to solid foods.  You may eat anything you prefer, but it is better to start with liquids, then soup and crackers, and gradually work up to solid foods.   Please notify your doctor immediately if you have any unusual bleeding, trouble breathing,  redness and pain at the surgery site, drainage, fever, or pain not relieved by medication.    Additional Instructions:        Please contact your physician with any problems or Same Day Surgery at [phone], Monday through Friday 6 am to 4 pm, or [place] number at [phone].

## 2023-01-26 NOTE — Anesthesia Procedure Notes (Signed)
Procedure Name: Intubation Date/Time: 01/26/2023 12:08 PM  Performed by: Mohammed Kindle, CRNAPre-anesthesia Checklist: Patient identified, Emergency Drugs available, Suction available and Patient being monitored Patient Re-evaluated:Patient Re-evaluated prior to induction Oxygen Delivery Method: Circle system utilized Preoxygenation: Pre-oxygenation with 100% oxygen Induction Type: IV induction Ventilation: Mask ventilation without difficulty Laryngoscope Size: McGraph Grade View: Grade I Tube type: Oral Tube size: 7.0 mm Number of attempts: 1 Airway Equipment and Method: Stylet and Oral airway Placement Confirmation: ETT inserted through vocal cords under direct vision, positive ETCO2, breath sounds checked- equal and bilateral and CO2 detector Secured at: 21 cm Tube secured with: Tape Dental Injury: Teeth and Oropharynx as per pre-operative assessment

## 2023-01-26 NOTE — Op Note (Signed)
Indications: Anna Taylor is suffering from lumbar radiculopathy. The patient tried and failed conservative management, prompting surgical intervention.  Findings: large disc herniation  Preoperative Diagnosis:  M54.16 lumbar radiculopathy, R29.898 left leg weakness  Postoperative Diagnosis: same   EBL: 25 ml IVF: see AR ml Drains: none Disposition: Extubated and Stable to PACU Complications: none  No foley catheter was placed.   Preoperative Note:   Risks of surgery discussed include: infection, bleeding, stroke, coma, death, paralysis, CSF leak, nerve/spinal cord injury, numbness, tingling, weakness, complex regional pain syndrome, recurrent stenosis and/or disc herniation, vascular injury, development of instability, neck/back pain, need for further surgery, persistent symptoms, development of deformity, and the risks of anesthesia. The patient understood these risks and agreed to proceed.  Operative Note:   1) Left L5/S1 microdiscectomy  The patient was then brought from the preoperative center with intravenous access established.  The patient underwent general anesthesia and endotracheal tube intubation, and was then rotated on the [place] rail top where all pressure points were appropriately padded.  The skin was then thoroughly cleansed.  Perioperative antibiotic prophylaxis was administered.  Sterile prep and drapes were then applied and a timeout was then observed.  C-arm was brought into the field under sterile conditions, and the L5-S1 disc space identified and marked with an incision on the left 1cm lateral to midline.  Once this was complete a 3 cm incision was opened with the use of a #10 blade knife.  The Metrx tubes were sequentially advanced under lateral fluoroscopy until a 18 x 80 mm Metrx tube was placed over the facet and lamina and secured to the bed.    The microscope was then sterilely brought into the field and muscle creep was hemostased with a bipolar  and resected with a pituitary rongeur.  A Bovie extender was then used to expose the spinous process and lamina.  Careful attention was placed to not violate the facet capsule. A 3 mm matchstick drill bit was then used to make a hemi-laminotomy trough until the ligamentum flavum was exposed.  This was extended to the base of the spinous process.  Once this was complete and the underlying ligamentum flavum was visualized, the ligamentum was dissected with an up angle curette and resected with a #2 and #3 mm biting Kerrison.  The laminotomy opening was also expanded in similar fashion and hemostasis was obtained with Surgifoam and a patty as well as bone wax.  The rostral aspect of the caudal level of the lamina was also resected with a #2 biting Kerrison effort to further enhance exposure.  Once the underlying dura was visualized a Penfield 4 was then used to dissect and expose the traversing nerve root.  Once this was identified a nerve root retractor suction was used to mobilize this medially.  The venous plexus was hemostased with Surgifoam and light bipolar use.  A small penfied was then used to make a small annulotomy within the disc space and disc space contents were noted to come through the annulus.    The disc herniation was identified and dissected free using a balltip probe. The pituitary rongeur was used to remove the extruded disc fragments. Once the thecal sac and nerve root were noted to be relaxed and under less tension the ball-tipped feeler was passed along the foramen distally to ensure no residual compression was noted.    Depo-Medrol was placed along the nerve root.  The area was irrigated. The tube system was then removed under microscopic  visualization and hemostasis was obtained with a bipolar.    The fascial layer was reapproximated with the use of a 0- Vicryl suture.  Subcutaneous tissue layer was reapproximated using 2-0 Vicryl suture.  3-0 monocryl was used on the skin. The skin was  then cleansed and Dermabond was used to close the skin opening.  Patient was then rotated back to the preoperative bed awakened from anesthesia and taken to recovery all counts are correct in this case.   I performed the entire procedure with the assistance of Manning Charity PA as an Designer, television/film set. An assistant was required for this procedure due to the complexity.  The assistant provided assistance in tissue manipulation and suction, and was required for the successful and safe performance of the procedure. I performed the critical portions of the procedure.   Venetia Night MD

## 2023-01-26 NOTE — Discharge Summary (Signed)
Discharge Summary  Patient ID: Anna Taylor MRN: [id number] DOB/AGE: 49/23/1977 49 y.o.  Admit date: 01/26/2023 Discharge date: 01/26/2023  Admission Diagnoses: M54.16 lumbar radiculopathy, R29.898 left leg weakness   Discharge Diagnoses:  Active Problems:   Lumbar radiculopathy   Left leg weakness   Discharged Condition: good  Hospital Course:  Anna Taylor is a 47 y.o presenting with lumbar radiculopathy s/p left L5-S1 microdiscectomy. Her intraoperative course was uncomplicated. She was monitored in PACU and discharged home after ambulating, urinating, and tolerating PO intake. She was given prescriptions for pain medication and a stool softener.  Consults: None  Significant Diagnostic Studies: none  Treatments: surgery: as above. Please see separately dictated operative report for further details.   Discharge Exam: Blood pressure 114/80, pulse 90, temperature 97.8 F (36.6 C), temperature source Temporal, resp. rate 18, height 5' 6.5" (1.689 m), weight 106.6 kg, last menstrual period 12/17/2022, SpO2 97 %. CN II-XII grossly intact MEAW Incision c/d/I with clean op-site in place  Disposition: Discharge disposition: 01-Home or Self Care        Allergies as of 01/26/2023       Reactions   Levaquin [levofloxacin In D5w] Other (See Comments)   Sunburn stinging        Medication List     TAKE these medications    gabapentin 300 MG capsule Commonly known as: NEURONTIN 1 po q hs x 3 days, then 1 po bid.   levothyroxine 150 MCG tablet Commonly known as: SYNTHROID Take 150 mcg by mouth daily.   methocarbamol 500 MG tablet Commonly known as: ROBAXIN TAKE 1 TABLET (500 MG) BY MOUTH EVERY 8 HOURS AS NEEDED FOR MUSCLE SPASMS. THIS CAN MAKE YOU SLEEPY.   naproxen 500 MG EC tablet Commonly known as: EC-Naproxen Take 1 tablet (500 mg total) by mouth 2 (two) times daily with a meal.   oxyCODONE 5 MG immediate release tablet Commonly known as:  Roxicodone Take 1 tablet (5 mg total) by mouth every 4 (four) hours as needed for severe pain.   pantoprazole 40 MG tablet Commonly known as: PROTONIX TAKE 1 TABLET BY MOUTH EVERY DAY   senna 8.6 MG Tabs tablet Commonly known as: SENOKOT Take 1 tablet (8.6 mg total) by mouth daily as needed for mild constipation.   sertraline 50 MG tablet Commonly known as: ZOLOFT Take 50 mg by mouth daily.        Follow-up Information     Drake Leach, PA-C Follow up on 02/09/2023.   Specialty: Neurosurgery Contact information: [address] [place] [address] [phone]                 Signed: Susanne Borders 01/26/2023, 1:44 PM

## 2023-01-26 NOTE — Anesthesia Preprocedure Evaluation (Addendum)
Anesthesia Evaluation  Patient identified by MRN, date of birth, ID band Patient awake    Reviewed: Allergy & Precautions, NPO status , Patient's Chart, lab work & pertinent test results  Airway Mallampati: II  TM Distance: >3 FB Neck ROM: Full    Dental  (+) Teeth Intact,    Pulmonary neg pulmonary ROS   Pulmonary exam normal breath sounds clear to auscultation       Cardiovascular Exercise Tolerance: Good negative cardio ROS Normal cardiovascular exam Rhythm:Regular Rate:Normal     Neuro/Psych   Anxiety     negative neurological ROS  negative psych ROS   GI/Hepatic negative GI ROS, Neg liver ROS,GERD  Medicated,,  Endo/Other  negative endocrine ROSHypothyroidism  Morbid obesity  Renal/GU negative Renal ROS  negative genitourinary   Musculoskeletal   Abdominal  (+) + obese  Peds negative pediatric ROS (+)  Hematology negative hematology ROS (+)   Anesthesia Other Findings Past Medical History: No date: Anxiety No date: Arthritis No date: Depression No date: GERD (gastroesophageal reflux disease) No date: Hypothyroidism No date: Irritable bowel syndrome (IBS) No date: Neuromuscular disorder No date: Thyroid disease  Past Surgical History: No date: bunion removal; Right No date: CHOLECYSTECTOMY No date: COLONOSCOPY No date: WISDOM TOOTH EXTRACTION  BMI    Body Mass Index: 37.36 kg/m      Reproductive/Obstetrics negative OB ROS                             Anesthesia Physical Anesthesia Plan  ASA: 2  Anesthesia Plan: General   Post-op Pain Management:    Induction: Intravenous  PONV Risk Score and Plan: 1 and Ondansetron and Dexamethasone  Airway Management Planned: Oral ETT  Additional Equipment:   Intra-op Plan:   Post-operative Plan: Extubation in OR  Informed Consent: I have reviewed the patients History and Physical, chart, labs and discussed the  procedure including the risks, benefits and alternatives for the proposed anesthesia with the patient or authorized representative who has indicated his/her understanding and acceptance.     Dental Advisory Given  Plan Discussed with: CRNA and Surgeon  Anesthesia Plan Comments:        Anesthesia Quick Evaluation

## 2023-01-26 NOTE — Transfer of Care (Signed)
Immediate Anesthesia Transfer of Care Note  Patient: Anna Taylor  Procedure(s) Performed: LEFT L5-S1 MICRODISCECTOMY (Left: Spine Lumbar)  Patient Location: PACU  Anesthesia Type:General  Level of Consciousness: awake, drowsy, and patient cooperative  Airway & Oxygen Therapy: Patient Spontanous Breathing and Patient connected to face mask oxygen  Post-op Assessment: Report given to RN and Post -op Vital signs reviewed and stable  Post vital signs: Reviewed and stable  Last Vitals:  Vitals Value Taken Time  BP 143/82 01/26/23 1350  Temp 36.3 C 01/26/23 1350  Pulse 109 01/26/23 1350  Resp 16 01/26/23 1350  SpO2 96 % 01/26/23 1350  Vitals shown include unvalidated device data.  Last Pain:  Vitals:   01/26/23 1022  TempSrc: Temporal  PainSc: 5          Complications: No notable events documented.

## 2023-01-26 NOTE — Interval H&P Note (Signed)
History and Physical Interval Note:  01/26/2023 11:24 AM  Anna Taylor  has presented today for surgery, with the diagnosis of M54.16 lumbar radiculopathy R29.898 left leg weakness.  The various methods of treatment have been discussed with the patient and family. After consideration of risks, benefits and other options for treatment, the patient has consented to  Procedure(s): LEFT L5-S1 MICRODISCECTOMY (Left) as a surgical intervention.  The patient's history has been reviewed, patient examined, no change in status, stable for surgery.  I have reviewed the patient's chart and labs.  Questions were answered to the patient's satisfaction.    Heart sounds normal no MRG. Chest Clear to Auscultation Bilaterally.  Mikale Silversmith

## 2023-01-29 ENCOUNTER — Other Ambulatory Visit: Payer: Self-pay

## 2023-01-29 ENCOUNTER — Encounter: Payer: Self-pay | Admitting: Neurosurgery

## 2023-01-31 ENCOUNTER — Ambulatory Visit: Payer: BC Managed Care – PPO | Admitting: Orthopedic Surgery

## 2023-01-31 NOTE — Anesthesia Postprocedure Evaluation (Signed)
Anesthesia Post Note  Patient: Anna Taylor  Procedure(s) Performed: LEFT L5-S1 MICRODISCECTOMY (Left: Spine Lumbar)  Patient location during evaluation: PACU Anesthesia Type: General Level of consciousness: awake and awake and alert Pain management: pain level controlled Vital Signs Assessment: post-procedure vital signs reviewed and stable Respiratory status: spontaneous breathing and respiratory function stable Cardiovascular status: blood pressure returned to baseline Anesthetic complications: no   No notable events documented.   Last Vitals:  Vitals:   01/26/23 1522 01/26/23 1526  BP: (!) 147/79   Pulse: (!) 122 (!) 115  Resp: 18   Temp: (!) 36.2 C   SpO2: 96%     Last Pain:  Vitals:   01/26/23 1522  TempSrc: Temporal  PainSc: 4                  VAN STAVEREN,Domingos Riggi

## 2023-02-08 ENCOUNTER — Telehealth: Payer: Self-pay

## 2023-02-08 NOTE — Telephone Encounter (Signed)
-----   Message from Cristin E Ray sent at 02/08/2023  7:57 AM EDT ----- Regarding: RE: reschedule appt Wants to be seen the week of May 6, have her on the schedule for 05/07 @ 2:30 with Kennyth Arnold ----- Message ----- From: Sharlot Gowda, RN Sent: 02/07/2023   6:35 PM EDT To: Susanne Borders, PA; [facility] Subject: FW: reschedule appt                            She declined. She wants to see Kennyth Arnold (she saw [facility] before surgery). I sent her another mychart message offering her an appt with Stacy on 4/30 at 3:30 or 5/1 at 8:30. But I asked her to send a pic of her incision this week. Waiting on a response. ----- Message ----- From: Sharlot Gowda, RN Sent: 02/07/2023   5:59 PM EDT To: [facility] Subject: reschedule appt                                Cristin left her a message and I sent her a mychart message about moving from Friday to 4/25 at 2:45pm with Danielle. If she can't do that, offer 3:30pm with Stacy on 4/30

## 2023-02-08 NOTE — Telephone Encounter (Signed)
Noted  

## 2023-02-09 ENCOUNTER — Encounter: Payer: BC Managed Care – PPO | Admitting: Neurosurgery

## 2023-02-09 ENCOUNTER — Encounter: Payer: BC Managed Care – PPO | Admitting: Orthopedic Surgery

## 2023-02-15 ENCOUNTER — Other Ambulatory Visit: Payer: Self-pay | Admitting: Orthopedic Surgery

## 2023-02-15 DIAGNOSIS — M5416 Radiculopathy, lumbar region: Secondary | ICD-10-CM

## 2023-02-15 DIAGNOSIS — M5442 Lumbago with sciatica, left side: Secondary | ICD-10-CM

## 2023-02-18 NOTE — Progress Notes (Unsigned)
   REFERRING PHYSICIAN:  [facility] [address] [place],  [place] [address]  DOS: 01/26/23  Left L5-S1 microdiscectomy   HISTORY OF PRESENT ILLNESS: Anna Taylor is approximately 3 and a half weeks status post left L5-S1 microdiscectomy.   She is doing very well with no pain in her left leg! She has minimal soreness in her lower back. No numbness, tingling, or weakness.   She is not taking the oxycodone or robaxin.    PHYSICAL EXAMINATION:  General: Patient is well developed, well nourished, calm, collected, and in no apparent distress.   NEUROLOGICAL:  General: In no acute distress.   Awake, alert, oriented to person, place, and time.  Pupils equal round and reactive to light.  Facial tone is symmetric.     Strength:            Side Iliopsoas Quads Hamstring PF DF EHL  R 5 5 5 5 5 5   L 5 5 5 5 5 5    Incision c/d/i   ROS (Neurologic):  Negative except as noted above  IMAGING: Nothing new to review.   ASSESSMENT/PLAN:  Anna Taylor is doing very well s/p above surgery. Treatment options reviewed with patient and following plan made:   - I have advised the patient to lift up to 10 pounds until 6 weeks after surgery (follow up with Dr. Myer Haff).  - Reviewed wound care.  - No bending, twisting, or lifting.  - Follow up as scheduled in 4 weeks and prn.   Advised to contact the office if any questions or concerns arise.  Drake Leach PA-C Department of neurosurgery

## 2023-02-20 ENCOUNTER — Encounter: Payer: Self-pay | Admitting: Orthopedic Surgery

## 2023-02-20 ENCOUNTER — Ambulatory Visit (INDEPENDENT_AMBULATORY_CARE_PROVIDER_SITE_OTHER): Payer: BC Managed Care – PPO | Admitting: Orthopedic Surgery

## 2023-02-20 VITALS — BP 118/81 | Ht 66.5 in | Wt 235.0 lb

## 2023-02-20 DIAGNOSIS — Z9889 Other specified postprocedural states: Secondary | ICD-10-CM

## 2023-02-20 DIAGNOSIS — Z09 Encounter for follow-up examination after completed treatment for conditions other than malignant neoplasm: Secondary | ICD-10-CM

## 2023-02-20 DIAGNOSIS — M5416 Radiculopathy, lumbar region: Secondary | ICD-10-CM

## 2023-02-20 DIAGNOSIS — M5126 Other intervertebral disc displacement, lumbar region: Secondary | ICD-10-CM

## 2023-03-13 ENCOUNTER — Encounter: Payer: BC Managed Care – PPO | Admitting: Neurosurgery

## 2023-03-27 ENCOUNTER — Encounter: Payer: BC Managed Care – PPO | Admitting: Neurosurgery

## 2023-04-02 NOTE — Progress Notes (Unsigned)
   REFERRING PHYSICIAN:  [facility] [address] [place],  [place] [address]  DOS: 01/26/23  Left L5-S1 microdiscectomy   HISTORY OF PRESENT ILLNESS: Anna Taylor is approximately 3 and a half weeks status post left L5-S1 microdiscectomy.       She is doing very well with no pain in her left leg! She has minimal soreness in her lower back. No numbness, tingling, or weakness.   She is not taking the oxycodone or robaxin.    PHYSICAL EXAMINATION:  General: Patient is well developed, well nourished, calm, collected, and in no apparent distress.   NEUROLOGICAL:  General: In no acute distress.   Awake, alert, oriented to person, place, and time.  Pupils equal round and reactive to light.  Facial tone is symmetric.     Strength:            Side Iliopsoas Quads Hamstring PF DF EHL  R 5 5 5 5 5 5   L 5 5 5 5 5 5    Incision well healed.    ROS (Neurologic):  Negative except as noted above  IMAGING: Nothing new to review.   ASSESSMENT/PLAN:  Anna Taylor is doing very well s/p above surgery. Treatment options reviewed with patient and following plan made:   - I have advised the patient to lift up to 10 pounds until 6 weeks after surgery (follow up with Dr. Myer Haff).  - Reviewed wound care.  - No bending, twisting, or lifting.  - Follow up as scheduled in 4 weeks and prn.   Advised to contact the office if any questions or concerns arise.  Drake Leach PA-C Department of neurosurgery

## 2023-04-03 ENCOUNTER — Ambulatory Visit (INDEPENDENT_AMBULATORY_CARE_PROVIDER_SITE_OTHER): Payer: BC Managed Care – PPO | Admitting: Orthopedic Surgery

## 2023-04-03 ENCOUNTER — Encounter: Payer: Self-pay | Admitting: Orthopedic Surgery

## 2023-04-03 VITALS — BP 118/72 | Ht 66.5 in | Wt 235.0 lb

## 2023-04-03 DIAGNOSIS — Z9889 Other specified postprocedural states: Secondary | ICD-10-CM

## 2023-04-03 DIAGNOSIS — M5126 Other intervertebral disc displacement, lumbar region: Secondary | ICD-10-CM

## 2023-04-03 DIAGNOSIS — Z09 Encounter for follow-up examination after completed treatment for conditions other than malignant neoplasm: Secondary | ICD-10-CM

## 2023-04-03 DIAGNOSIS — M5416 Radiculopathy, lumbar region: Secondary | ICD-10-CM

## 2023-04-12 ENCOUNTER — Other Ambulatory Visit: Payer: Self-pay | Admitting: Orthopedic Surgery

## 2023-04-12 DIAGNOSIS — M5442 Lumbago with sciatica, left side: Secondary | ICD-10-CM

## 2023-04-12 DIAGNOSIS — M5416 Radiculopathy, lumbar region: Secondary | ICD-10-CM

## 2023-04-13 ENCOUNTER — Encounter: Payer: BC Managed Care – PPO | Admitting: Orthopedic Surgery

## 2023-04-16 NOTE — Telephone Encounter (Signed)
Sent her a message to see if she is still taking robaxin. Will deny script for now until I hear back from her.

## 2023-04-17 ENCOUNTER — Other Ambulatory Visit: Payer: Self-pay | Admitting: Neurosurgery

## 2023-04-17 DIAGNOSIS — M5442 Lumbago with sciatica, left side: Secondary | ICD-10-CM

## 2023-04-17 DIAGNOSIS — M5416 Radiculopathy, lumbar region: Secondary | ICD-10-CM

## 2023-06-05 ENCOUNTER — Encounter: Payer: BC Managed Care – PPO | Admitting: Neurosurgery

## 2023-06-11 ENCOUNTER — Telehealth: Payer: Self-pay | Admitting: Neurosurgery

## 2023-06-11 ENCOUNTER — Encounter: Payer: Self-pay | Admitting: Family Medicine

## 2023-06-11 NOTE — Telephone Encounter (Signed)
  Media Information  Document Information  Message from Friday night, please review.

## 2023-06-11 NOTE — Telephone Encounter (Signed)
Note is done and sent via Mychart, also a message was sent to the patient.

## 2023-06-11 NOTE — Telephone Encounter (Signed)
Looks like patient did not show up for her last Post Op appointment. Please advise on work note.

## 2023-06-11 NOTE — Telephone Encounter (Signed)
Okay to give note to return to work

## 2023-07-18 ENCOUNTER — Telehealth: Payer: Self-pay | Admitting: Orthopedic Surgery

## 2023-07-18 NOTE — Telephone Encounter (Signed)
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   Thoracic MRI ordered and was not done.   She had Left L5-S1 microdiscectomy on 01/26/23 and did great.   Reviewed with Dr. Myer Haff and he still recommends MRI of thoracic spine to further evaluate cyst.   Called patient and left her a message to call me back. Will also send her a MyChart message.

## 2023-07-27 NOTE — Telephone Encounter (Signed)
Tried calling patient again and left a VM to call me back.

## 2023-07-30 ENCOUNTER — Telehealth: Payer: Self-pay | Admitting: Orthopedic Surgery

## 2023-07-30 DIAGNOSIS — G9689 Other specified disorders of central nervous system: Secondary | ICD-10-CM

## 2023-07-30 NOTE — Telephone Encounter (Signed)
Noted. I will obtain authorization for this.

## 2023-07-30 NOTE — Telephone Encounter (Signed)
-----   Message from Drake Leach M sent at 07/27/2023  2:17 PM EDT -----  ----- Message ----- From: Drake Leach, PA-C Sent: 07/27/2023  12:00 AM EDT To: Drake Leach, PA-C   ----- Message ----- From: Drake Leach, PA-C Sent: 07/26/2023  12:00 AM EDT To: Drake Leach, PA-C   ----- Message ----- From: Drake Leach, Cordelia Poche Sent: 07/25/2023  12:00 AM EDT To: Drake Leach, PA-C   ----- Message ----- From: Drake Leach, Cordelia Poche Sent: 07/23/2023  12:00 AM EDT To: Drake Leach, PA-C  Did I hear back from her? 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 This is incompletely visualized by this examination, but could be symptomatic based on prior CT appearance. If this has not been evaluated elsewhere, recommend further evaluation with dedicated thoracic MRI without and with contrast recommended.  I ordered MRI of thoracic spine back in March and it was not done. You saw her after this and scheduled above surgery. No mention made of MRI.   She was doing well postop and was released to f/u prn. Does thoracic MRI still need to be done?   Thanks.

## 2023-07-30 NOTE — Telephone Encounter (Signed)
Disregard letter. I finally spoke with patient and she is agreeable to getting thoracic MRI done.   Irving Burton- I put in a new order in case you need it.   Will plan to do phone visit to review results.

## 2024-01-07 DIAGNOSIS — G9689 Other specified disorders of central nervous system: Secondary | ICD-10-CM

## 2024-01-10 NOTE — Addendum Note (Signed)
 Addended by: Barbette Hair on: 01/10/2024 04:42 PM   Modules accepted: Orders

## 2024-01-16 ENCOUNTER — Ambulatory Visit
Admission: RE | Admit: 2024-01-16 | Discharge: 2024-01-16 | Disposition: A | Discharge status: Home / Self Care | Payer: Self-pay | Source: Ambulatory Visit | Attending: Orthopedic Surgery | Admitting: Orthopedic Surgery

## 2024-01-16 DIAGNOSIS — G9689 Other specified disorders of central nervous system: Secondary | ICD-10-CM | POA: Insufficient documentation

## 2024-01-16 MED ORDER — GADOBUTROL 1 MMOL/ML IV SOLN
10.0000 mL | Freq: Once | INTRAVENOUS | Status: AC | PRN
  Administered 2024-01-16: 10 mL via INTRAVENOUS

## 2024-01-23 ENCOUNTER — Encounter: Payer: Self-pay | Admitting: Orthopedic Surgery

## 2024-01-23 NOTE — Progress Notes (Signed)
 MRI of thoracic spine dated 01/16/24:  FINDINGS: Alignment:  Normal   Vertebrae: Bony scalloping leftward at T10 and T11 affecting the posterior elements and vertebral bodies. No evidence of associated intrinsic bone lesion. Incidental hemangioma in the T12 body without aggressive features.   Cord: The cord is displaced to the right with thecal sac narrowing due to extradural cyst in the left and posterior canal at T10 and T11, extending out of the T10-11 and T11-12 foramina. The cyst is long-standing with bony scalloping and stable appearance compared to prior CT. No enhancing components are seen, craniocaudal span is 5.3 cm. No additional cyst is seen.   Paraspinal and other soft tissues: As above   Disc levels:   T11-12 small left paracentral herniation which is upward pointing, noncompressive. No significant facet spurring.   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 There is narrowing of the thecal sac with rightward cord displacement.     Electronically Signed   By: Tiburcio Pea M.D.   On: 01/21/2024 08:55  I have personally reviewed the images and agree with the above interpretation.  Above imaging reviewed with Dr. Myer Haff. He recommends follow up scan in 2 years. Will schedule f/u with patient to review.

## 2024-01-25 NOTE — Progress Notes (Signed)
   Telephone Visit- Progress Note: Referring Physician:  [facility] [address] [place],  [place] [address]  Primary Physician:  [facility]  This visit was performed via telephone.  Patient location: home Provider location: office  I spent a total of 10 minutes non-face-to-face activities for this visit on the date of this encounter including review of current clinical condition and response to treatment.    Patient has given verbal consent to this telephone visits and we reviewed the limitations of a telephone visit. Patient wishes to proceed.    Chief Complaint:  review thoracic MRI results  History of Present Illness: Liah Morr Norenberg is a 48 y.o. female has a history of thyroid disease.   She is s/p left L5-S1 microdiscectomy on 01/26/23. She did well after surgery.   Preop lumbar MRI showed partially visualized cyst in thoracic spine. Thoracic MRI recommended for further evaluation.   She finally had thoracic MRI done and phone visit scheduled to review the results.   She has occasional LBP, but no leg pain. She is doing well and back doing her normal activity.   She has no thoracic pain. No radiation of pain to her ribs. No dexterity or balance issues.   No bowel or bladder issues.   Exam: No exam done as this was a telephone encounter.     Imaging: Thoracic MRI dated 01/16/24:  FINDINGS: Alignment:  Normal   Vertebrae: Bony scalloping leftward at T10 and T11 affecting the posterior elements and vertebral bodies. No evidence of associated intrinsic bone lesion. Incidental hemangioma in the T12 body without aggressive features.   Cord: The cord is displaced to the right with thecal sac narrowing due to extradural cyst in the left and posterior canal at T10 and T11, extending out of the T10-11 and T11-12 foramina. The cyst is long-standing with bony scalloping and stable appearance compared to prior CT. No enhancing  components are seen, craniocaudal span is 5.3 cm. No additional cyst is seen.   Paraspinal and other soft tissues: As above   Disc levels:   T11-12 small left paracentral herniation which is upward pointing, noncompressive. No significant facet spurring.   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 There is narrowing of the thecal sac with rightward cord displacement.     Electronically Signed   By: Ronnette Coke M.D.   On: 01/21/2024 08:55  I have personally reviewed the images and agree with the above interpretation. Above MRI reviewed with Dr. Mont Antis prior to her visit.   Assessment and Plan: Ms. 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   She has no symptoms.   Treatment options discussed with Dr. Mont Antis prior to her visit. Following plan made with patient:   - No treatment recommended at this time as she has no symptoms.  - Dr. Mont Antis recommends repeat thoracic MRI with and without contrast in 2 years. This was ordered.  - She will call if she develops any symptoms- this was reviewed at length (pain, weakness, numbness/tingling, dexterity/balance issues, bowel/lbladder issues).  - She will f/u prn for now.   Lucetta Russel PA-C Neurosurgery

## 2024-01-28 ENCOUNTER — Encounter: Payer: Self-pay | Admitting: Orthopedic Surgery

## 2024-01-28 ENCOUNTER — Ambulatory Visit (INDEPENDENT_AMBULATORY_CARE_PROVIDER_SITE_OTHER): Admitting: Orthopedic Surgery

## 2024-01-28 DIAGNOSIS — G96198 Other disorders of meninges, not elsewhere classified: Secondary | ICD-10-CM | POA: Diagnosis not present

## 2024-02-20 ENCOUNTER — Encounter (HOSPITAL_COMMUNITY): Payer: Self-pay

## 2024-02-27 ENCOUNTER — Ambulatory Visit: Payer: Self-pay | Admitting: Internal Medicine

## 2024-02-27 ENCOUNTER — Encounter: Payer: Self-pay | Admitting: Internal Medicine

## 2024-02-27 VITALS — BP 132/74 | HR 79 | Temp 98.0°F | Ht 66.5 in | Wt 235.6 lb

## 2024-02-27 DIAGNOSIS — Z6837 Body mass index (BMI) 37.0-37.9, adult: Secondary | ICD-10-CM

## 2024-02-27 DIAGNOSIS — M51369 Other intervertebral disc degeneration, lumbar region without mention of lumbar back pain or lower extremity pain: Secondary | ICD-10-CM | POA: Diagnosis not present

## 2024-02-27 DIAGNOSIS — R87810 Cervical high risk human papillomavirus (HPV) DNA test positive: Secondary | ICD-10-CM

## 2024-02-27 DIAGNOSIS — K449 Diaphragmatic hernia without obstruction or gangrene: Secondary | ICD-10-CM | POA: Insufficient documentation

## 2024-02-27 DIAGNOSIS — E0500 Thyrotoxicosis with diffuse goiter without thyrotoxic crisis or storm: Secondary | ICD-10-CM

## 2024-02-27 DIAGNOSIS — E890 Postprocedural hypothyroidism: Secondary | ICD-10-CM | POA: Diagnosis not present

## 2024-02-27 DIAGNOSIS — F32A Depression, unspecified: Secondary | ICD-10-CM | POA: Insufficient documentation

## 2024-02-27 DIAGNOSIS — K219 Gastro-esophageal reflux disease without esophagitis: Secondary | ICD-10-CM

## 2024-02-27 DIAGNOSIS — Z9049 Acquired absence of other specified parts of digestive tract: Secondary | ICD-10-CM

## 2024-02-27 DIAGNOSIS — R87610 Atypical squamous cells of undetermined significance on cytologic smear of cervix (ASC-US): Secondary | ICD-10-CM

## 2024-02-27 DIAGNOSIS — R6882 Decreased libido: Secondary | ICD-10-CM | POA: Insufficient documentation

## 2024-02-27 DIAGNOSIS — R29818 Other symptoms and signs involving the nervous system: Secondary | ICD-10-CM

## 2024-02-27 DIAGNOSIS — E65 Localized adiposity: Secondary | ICD-10-CM

## 2024-02-27 DIAGNOSIS — G96198 Other disorders of meninges, not elsewhere classified: Secondary | ICD-10-CM

## 2024-02-27 HISTORY — DX: Acquired absence of other specified parts of digestive tract: Z90.49

## 2024-02-27 LAB — POCT GLYCOSYLATED HEMOGLOBIN (HGB A1C): Hemoglobin A1C: 5.6 % (ref 4.0–5.6)

## 2024-02-27 MED ORDER — BD ECLIPSE LUER-LOK NEEDLE 30G X 1/2" MISC
1.0000 | 0 refills | Status: DC
Start: 2024-02-27 — End: 2024-04-03

## 2024-02-27 MED ORDER — TIRZEPATIDE-WEIGHT MANAGEMENT 10 MG/0.5ML ~~LOC~~ SOLN
10.0000 mg | SUBCUTANEOUS | 5 refills | Status: DC
Start: 2024-02-27 — End: 2024-02-27

## 2024-02-27 MED ORDER — SYRINGE/NEEDLE (DISP) 20G X 1" 1 ML MISC
1.0000 | 0 refills | Status: DC
Start: 2024-02-27 — End: 2024-04-03

## 2024-02-27 MED ORDER — TIRZEPATIDE-WEIGHT MANAGEMENT 2.5 MG/0.5ML ~~LOC~~ SOAJ
2.5000 mg | SUBCUTANEOUS | 11 refills | Status: DC
Start: 2024-02-27 — End: 2024-04-03

## 2024-02-27 MED ORDER — TIRZEPATIDE-WEIGHT MANAGEMENT 10 MG/0.5ML ~~LOC~~ SOLN
10.0000 mg | SUBCUTANEOUS | 5 refills | Status: DC
Start: 2024-02-27 — End: 2024-04-03

## 2024-02-27 NOTE — Patient Instructions (Signed)
 Lifestyle efforts: Are you currently following any diet or exercise program to lose weight? If yes, what are you doing? (For example: "I'm on a low-carb diet and walking 30 minutes daily." If no, just answer "No.") Duration of efforts: Approximately how long have you been trying to lose weight through diet and exercise? (e.g. "6 months," "on and off for 2 years," etc.) Past programs & results: What weight-loss programs or methods have you tried in the past, and what were the results? (Examples: "Joined Weight Watchers for 6 months and lost 10 lbs, but regained it" or "Tried a gym routine for a year with minimal weight change.") Previous medications: Have you ever used any weight-loss medications (such as phentermine, Saxenda, etc.)? If yes, please list which ones, how long you took them, and why you stopped (e.g. "Tried phentermine for 3 months in 2021 but had side effects, so stopped"). If no, write "No." Health conditions: Do you have any health conditions related to your weight? For example: diabetes, high blood pressure, high cholesterol, sleep apnea, joint pain/arthritis, or others. (Please list all that apply to you, or say "None.") Current stats: What is your current weight? _______ (lbs or kg) & height? _______ (feet/inches or cm). (Approximate is okay if you're not sure. This helps us  calculate your BMI.) If you feel BMI doesn't portray your health well, please give us  your waist circumference and/or caliper measurements or even pictures displaying visceral fat. Additional info: Is there anything else about your health or weight-loss journey you want us  to know for the insurance appeal? (For instance, how your weight affects your daily life or why this medication is important to you. You can also mention if you're committed to maintaining diet and exercise along with the medication.) Thank you! Please reply to this message with your answers. You can number your responses 1 through 7 to match the  questions. Your detailed answers will help us  write a strong appeal letter to your insurance. Reference the below questionnaire to help jog your memory. But its best if you free text your experiences in detail.  GLP-1 Medication Authorization Questionnaire  This questionnaire helps your healthcare provider obtain insurance approval for GLP-1 weight-loss medication. Please answer as completely as possible. This information is for your medical record and insurance support; only you and your care team will see these answers.  BASIC INFORMATION  ?? Today's Date:  02/28/2024  ?? Current Weight (lbs):   Wt Readings from Last 10 Encounters:  02/27/24 235 lb 9.6 oz (106.9 kg)  04/03/23 235 lb (106.6 kg)  02/20/23 235 lb (106.6 kg)  01/26/23 235 lb 0.2 oz (106.6 kg)  01/18/23 235 lb 0.2 oz (106.6 kg)  01/11/23 230 lb (104.3 kg)  01/10/23 230 lb (104.3 kg)  12/11/22 230 lb (104.3 kg)  11/27/22 230 lb (104.3 kg)  11/04/18 212 lb (96.2 kg)    ?? Height (inches):   Ht Readings from Last 3 Encounters:  02/27/24 5' 6.5" (1.689 m)  04/03/23 5' 6.5" (1.689 m)  02/20/23 5' 6.5" (1.689 m)   ?? Most Recent A1c (%): Date:   Lab Results  Component Value Date   HGBA1C 5.6 02/27/2024    WEIGHT HISTORY  Please provide additional weight data from the past 2 years if available.  Wt Readings from Last 50 Encounters:  02/27/24 235 lb 9.6 oz (106.9 kg)  04/03/23 235 lb (106.6 kg)  02/20/23 235 lb (106.6 kg)  01/26/23 235 lb 0.2 oz (106.6 kg)  01/18/23 235  lb 0.2 oz (106.6 kg)  01/11/23 230 lb (104.3 kg)  01/10/23 230 lb (104.3 kg)  12/11/22 230 lb (104.3 kg)  11/27/22 230 lb (104.3 kg)  11/04/18 212 lb (96.2 kg)  09/10/18 220 lb (99.8 kg)  08/16/18 220 lb (99.8 kg)  07/06/18 225 lb (102.1 kg)  06/01/16 220 lb (99.8 kg)  07/04/15 207 lb 8 oz (94.1 kg)    Please include explanation of the changes in our data:   DIET PROGRAMS  ?? Check any diet programs you have tried for weight loss:  Write in  details for your experience with the program and why it didn't work for you. ?    [] Low-carb/Keto diet [] Mediterranean diet [] Intermittent fasting [] Weight Watchers/WW [] Delight Felts [] Noom [] Calorie counting apps [] Nutrisystem [] Low-fat diet [] Plant-based/Vegan diet [] Meal replacement programs [] Medical weight management  ?? Provide details about your weight loss programs:    DIET CHALLENGES  ?? What factors limited the success or sustainability of these weight loss efforts?    [] Cost/affordability [] Time constraints [] Hunger/constant food cravings [] Difficulty maintaining long-term [] Health issues that interfered [] Lack of results despite effort [] Difficulty with food preparation [] Family/social challenges   Additional details about diet challenges:    PREVIOUS WEIGHT-LOSS MEDICATIONS  ?? Check any medications or supplements you have tried specifically for weight loss:   [] Phentermine (Adipex-P, Lomaira) [] Orlistat (Xenical, Alli) [] Qsymia (phentermine/topiramate) [] Contrave (naltrexone/bupropion) [] Saxenda (liraglutide) [] Wegovy (semaglutide) [] Zepbound (tirzepatide) [] Mounjaro (tirzepatide) [] Compounded GLP-1 medication [] OTC weight loss supplements  []  Other weight medication:  ?? Provide details about your weight loss medication experiences:    EXERCISE HISTORY  ?? Check any activities you currently do regularly (at least once per week):  [] Walking [] Running/jogging [] Cycling/stationary bike [] Swimming [] Weight training [] Group fitness classes [] Yoga/Pilates [] Home workout videos/apps [] Sports [] Other: []  I do not currently exercise    Current exercise frequency and duration:    EXERCISE LIMITATIONS  ?? Check any factors that limit your ability to exercise:  [] Joint/muscle pain [] Shortness of breath during activity [] Chronic fatigue/low energy [] Lack of time (work/family) [] No safe place to exercise [] Can't afford gym/equipment [] Physical  disability or condition [] Lack of motivation   Additional details about exercise limitations:    MEDICAL CONDITIONS  ?? Check any symptoms you currently experience that may be related to your weight:  [] Joint or muscle pain [] Shortness of breath with activity [] Persistent fatigue or low energy [] Difficulty with mobility  [] Sleep disturbances or snoring [] Heartburn/acid reflux [] Swelling in legs/ankles [] None of the above   ?? Check any conditions you have been diagnosed with:  Our records confirm you has Left leg weakness; Atypical squamous cells of undetermined significance on cytologic smear of cervix (ASC-US ); HPV in female; Diaphragmatic hernia; Gastroesophageal reflux disease; Morbid obesity (HCC); Hypothyroidism following radioiodine therapy; Degeneration of intervertebral disc of lumbar region; Cyst of spinal meninges; BMI 37.0-37.9, adult; Suspected sleep apnea; Visceral obesity; and Low libido on their problem list. [] Obesity or Overweight (BMI >= 25) [] Obstructive Sleep Apnea [] High Blood Pressure [] High Cholesterol or Triglycerides [] Heart Disease [] Polycystic Ovary Syndrome (PCOS) [] Insulin Resistance [] Metabolic Syndrome [] Prediabetes [] Type 2 Diabetes [] Fatty Liver Disease [] Depression or Anxiety [] Osteoarthritis or Joint Problems [] GERD [] None of the above    ?? Current Medications:  Charted:  Current Outpatient Medications on File Prior to Visit  Medication Sig   levothyroxine (SYNTHROID, LEVOTHROID) 150 MCG tablet Take 150 mcg by mouth daily.   pantoprazole  (PROTONIX ) 40 MG tablet TAKE 1 TABLET BY MOUTH EVERY DAY   gabapentin  (NEURONTIN ) 300 MG capsule 1 po q hs x  3 days, then 1 po bid.   Levothyroxine Sodium 150 MCG CAPS Take 1 capsule by mouth daily at 6 (six) AM.   pantoprazole  (PROTONIX ) 40 MG tablet Take 40 mg by mouth daily.   No current facility-administered medications on file prior to visit.    Corrections to above:  ???? Family Medical  History:  [] Obesity or weight issues [] Type 2 Diabetes [] High Blood Pressure [] High Cholesterol [] Heart Attack or Stroke [] Other Important:  [] None of the above    QUALITY OF LIFE IMPACT  ?? Check any areas where your weight affects your daily functioning:  [] Physical mobility [] Daily self-care [] Work performance [] Social activities/relationships [] Intimate relationships [] Participation in hobbies [] Travel/public seating [] Shopping for clothing [] Public dining [] No significant impact   ?? Check any emotional effects you experience related to your weight:  [] Low self-esteem/poor body image [] Depression or persistent sadness [] Anxiety or worry [] Social avoidance/isolation [] Feelings of shame/embarrassment [] Stress or emotional eating [] Feeling judged by others [] No significant emotional impact   ? Additional details about quality-of-life impact:   EATING PATTERNS  ?? Check any that apply to your typical eating patterns:  [] Skipping meals regularly [] Emotional eating or stress eating [] Eating quickly/while distracted [] Large portion sizes  [] Nighttime eating/late snacking [] Frequent restaurant/takeout meals [] Grazing throughout the day [] Significant hunger between meals [] Binge eating episodes  ?? Food Triggers or Preferences:   WEIGHT MANAGEMENT GOALS  ?? What is your current weight goal?  [] Lose 5-10% of current weight [] Lose a specific amount: lbs  [] Reach a specific weight: lbs  [] Prevent further weight gain [] Improve health markers regardless of weight [] Reduce visceral fat and improve body composition ?? What are your primary reasons for wanting to lose weight?  [] Improve health/manage conditions [] Increase energy or stamina [] Reduce pain/improve mobility [] Improve appearance/fit into clothes [] Improve mental health/confidence [] Participate more fully in activities [] Doctor recommended weight loss [] Other: ?? Previous Healthcare Provider Discussions:     ADDITIONAL INFORMATION  Is there anything else you would like your healthcare provider and insurance company to know about your weight history, weight loss attempts, or health concerns?    PATIENT ACKNOWLEDGMENT  I confirm that the information provided in this questionnaire is accurate and complete to the best of my knowledge. I understand this information will be used to support my request for GLP-1 medication authorization. Patient Signature:                          Date:    -------------------------------------------------------------------------------------------- INSURANCE & AUTHORIZATION UNDERSTANDING  ?? Insurance Plan Information:  Insurance Provider:  Plan Type:  I have reviewed my insurance plan's coverage policy for weight management medications: Yes No Unsure  ?? Previous experience with insurance authorization:  Previous medication prior authorization (any type) Previous denial of weight-related treatment Successfully appealed insurance denial Familiar with insurance appeals process Currently work in healthcare/insurance Have resources to help with insurance issues ?? Financial considerations for treatment:  I would only proceed with treatment if insurance covers it I could pay out-of-pocket for a short time while appealing a denial I have researched manufacturer assistance programs I would like information about payment assistance options ?? Understanding of GLP-1 Authorization Requirements:  I understand insurance typically requires documented weight-related health conditions I understand insurance may require 3-6 months of documented diet/exercise attempts I understand insurance may require trial of other weight-loss medications first I understand GLP-1 approval often requires specific BMI thresholds (typically >=30, or >=27 with conditions) I understand insurance may require periodic documentation of weight  loss to continue coverage ?? Advocacy & Documentation  Readiness:  I have gathered medical records documenting weight-related health issues I have documentation of previous weight loss attempts (photos, apps, program receipts) I have a support person who can help me navigate insurance issues if needed I am comfortable contacting my insurance company to discuss coverage I am willing to file appeals if initially denied ?? Timeline & Expectations:  When would you hope to begin GLP-1 treatment?  I understand authorization process may take 2-8 weeks: Yes No  I understand approvals typically require renewal every 3-12 months: Yes No

## 2024-02-27 NOTE — Progress Notes (Unsigned)
 [facility]  Phone: [phone]  - Medical Office Visit -  Visit Date: 02/27/2024 Patient: Anna Taylor   DOB: [birth year]   48 y.o. Female  MRN: [id number] Patient Care Team: Anthon Kins, MD as PCP - General (Internal Medicine) Today's Health Care Provider: Anthon Kins, MD  ===========================================    Chief Complaint / Reason for Visit: new pt (Pt is present for est care with pcp. Weight management / lab work.)  Background: 48 y.o. female who has Lumbar radiculopathy and Left leg weakness on their problem list.  Discussed the use of AI scribe software for clinical note transcription with the patient, who gave verbal consent to proceed.  History of Present Illness      Problem overviews updated today: No problems updated.  Medications updated/reviewed: Current Outpatient Medications on File Prior to Visit  Medication Sig   levothyroxine (SYNTHROID, LEVOTHROID) 150 MCG tablet Take 150 mcg by mouth daily.   pantoprazole  (PROTONIX ) 40 MG tablet TAKE 1 TABLET BY MOUTH EVERY DAY   EC-NAPROXEN  500 MG EC tablet TAKE 1 TABLET BY MOUTH TWICE A DAY WITH FOOD   gabapentin  (NEURONTIN ) 300 MG capsule 1 po q hs x 3 days, then 1 po bid.   methocarbamol  (ROBAXIN ) 500 MG tablet TAKE 1 TABLET (500 MG) BY MOUTH EVERY 8 HOURS AS NEEDED FOR MUSCLE SPASMS. THIS CAN MAKE YOU SLEEPY.   sertraline (ZOLOFT) 50 MG tablet Take 50 mg by mouth daily.   No current facility-administered medications on file prior to visit.  There are no discontinued medications. Current Meds  Medication Sig   levothyroxine (SYNTHROID, LEVOTHROID) 150 MCG tablet Take 150 mcg by mouth daily.   pantoprazole  (PROTONIX ) 40 MG tablet TAKE 1 TABLET BY MOUTH EVERY DAY    Allergies:   Allergies as of 02/27/2024 - Review Complete 02/27/2024  Allergen Reaction Noted   Levaquin [levofloxacin in d5w] Other (See Comments) 08/12/2012   Past Medical History:  has a past  medical history of Anxiety, Arthritis, Depression, GERD (gastroesophageal reflux disease), Hypothyroidism, Irritable bowel syndrome (IBS), Neuromuscular disorder (HCC), and Thyroid disease. Past Surgical History:   has a past surgical history that includes Cholecystectomy; Colonoscopy; Wisdom tooth extraction; bunion removal (Right); and Lumbar laminectomy/decompression microdiscectomy (Left, 01/26/2023). Social History:   reports that she has never smoked. She has never used smokeless tobacco. She reports current alcohol use. She reports that she does not use drugs. Family History:  family history includes Colon polyps in her father; Heart disease in her maternal grandfather, paternal grandfather, and paternal grandmother; Prostate cancer in her father. Depression Screen and Health Maintenance:    02/27/2024    2:57 PM  PHQ 2/9 Scores  PHQ - 2 Score 0  PHQ- 9 Score 0   Health Maintenance  Topic Date Due   DTaP/Tdap/Td (2 - Tdap) 03/16/2014   Colonoscopy  Never done   COVID-19 Vaccine (1 - 2024-25 season) 03/14/2024 (Originally 06/17/2023)   Cervical Cancer Screening (HPV/Pap Cotest)  02/26/2025 (Originally 06/10/2004)   Hepatitis C Screening  02/26/2025 (Originally 06/10/1992)   HIV Screening  02/26/2025 (Originally 06/10/1989)   INFLUENZA VACCINE  05/16/2024   HPV VACCINES  Aged Out   Meningococcal B Vaccine  Aged Out   Immunization History  Administered Date(s) Administered   Influenza Inj Mdck Quad Pf 06/29/2018   Influenza,inj,Quad PF,6+ Mos 07/10/2019   Td 03/16/2004     Objective   Physical ExamBP 132/74   Pulse 79   Temp 98 F (36.7  C) (Temporal)   Ht 5' 6.5" (1.689 m)   Wt 235 lb 9.6 oz (106.9 kg)   LMP 01/28/2024 (Approximate)   SpO2 98%   BMI 37.46 kg/m  Wt Readings from Last 10 Encounters:  02/27/24 235 lb 9.6 oz (106.9 kg)  04/03/23 235 lb (106.6 kg)  02/20/23 235 lb (106.6 kg)  01/26/23 235 lb 0.2 oz (106.6 kg)  01/18/23 235 lb 0.2 oz (106.6 kg)  01/11/23 230 lb  (104.3 kg)  01/10/23 230 lb (104.3 kg)  12/11/22 230 lb (104.3 kg)  11/27/22 230 lb (104.3 kg)  11/04/18 212 lb (96.2 kg)  Vital signs reviewed.  Nursing notes reviewed. Weight trend reviewed. Abnormalities and problem-specific physical exam findings:  *** General Appearance:  Well developed, well nourished, well-groomed, healthy-appearing female with Body mass index is 37.46 kg/m. No acute distress appreciable.   Skin: Clear and well-hydrated. Pulmonary:  Normal work of breathing at rest, no respiratory distress apparent. SpO2: 98 %  Musculoskeletal: She demonstrates smooth and coordinated movements throughout all major joints.All extremities are intact.  Neurological:  Awake, alert, oriented, and engaged.  No obvious focal neurological deficits or cognitive impairments.  Sensorium seems unclouded.  Psychiatric:  Appropriate mood, pleasant and cooperative demeanor, cheerful and engaged during the exam  Reviewed Results & Data Results  {Insert previous labs (optional):23779} {See past labs  Heme  Chem  Endocrine  Serology  Results Review (optional):1}  No results found for any visits on 02/27/24.  No visits with results within 1 Year(s) from this visit.  Latest known visit with results is:  Admission on 01/26/2023, Discharged on 01/26/2023  Component Date Value   Specimen Description 01/18/2023                     Value:URINE, CLEAN CATCH Performed at [facility], [address], [place], [place] [address]    Special Requests 01/18/2023                     Value:NONE Performed at [facility], [address] [place], [place] [address]    Culture 01/18/2023  (A)                    Value:<10,000 COLONIES/mL INSIGNIFICANT GROWTH Performed at [facility], [address], [place], [place] [address]    Report Status 01/18/2023 01/19/2023 FINAL    ABO/RH(D) 01/26/2023                     Value:B POS Performed at [facility], [address] [place], [place] [address]    Preg Test, Ur 01/26/2023 NEGATIVE    No image results found.   MR THORACIC SPINE W WO CONTRAST Result Date: 01/21/2024 CLINICAL DATA:  Evaluate cystic mass. EXAM: MRI THORACIC WITHOUT AND WITH CONTRAST TECHNIQUE: Multiplanar and multiecho pulse sequences of the thoracic spine were obtained without and with intravenous contrast. CONTRAST:  10mL GADAVIST  GADOBUTROL  1 MMOL/ML IV SOLN COMPARISON:  Lumbar MRI 01/03/2023 and abdominal CT 07/02/2015 FINDINGS: Alignment:  Normal Vertebrae: Bony scalloping leftward at T10 and T11 affecting the posterior elements and vertebral bodies. No evidence of associated intrinsic bone lesion. Incidental hemangioma in the T12 body without aggressive features. Cord: The cord is displaced to the right with thecal sac narrowing due to extradural cyst in the left and posterior canal at T10 and T11, extending out of the T10-11 and T11-12 foramina. The cyst is long-standing with  bony scalloping and stable appearance compared to prior CT. No enhancing components are seen, craniocaudal span is 5.3 cm. No additional cyst is seen. Paraspinal and other soft tissues: As above Disc levels: T11-12 small left paracentral herniation which is upward pointing, noncompressive. No significant facet spurring. 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 There is narrowing of the thecal sac with rightward cord displacement. Electronically Signed   By: Ronnette Coke M.D.   On: 01/21/2024 08:55    MR THORACIC SPINE W WO CONTRAST Result Date: 01/21/2024 CLINICAL DATA:  Evaluate cystic mass. EXAM: MRI THORACIC WITHOUT AND WITH CONTRAST TECHNIQUE: Multiplanar and multiecho pulse sequences of the thoracic spine were obtained without and with intravenous contrast. CONTRAST:  10mL GADAVIST  GADOBUTROL  1 MMOL/ML IV SOLN COMPARISON:  Lumbar MRI 01/03/2023 and abdominal CT 07/02/2015  FINDINGS: Alignment:  Normal Vertebrae: Bony scalloping leftward at T10 and T11 affecting the posterior elements and vertebral bodies. No evidence of associated intrinsic bone lesion. Incidental hemangioma in the T12 body without aggressive features. Cord: The cord is displaced to the right with thecal sac narrowing due to extradural cyst in the left and posterior canal at T10 and T11, extending out of the T10-11 and T11-12 foramina. The cyst is long-standing with bony scalloping and stable appearance compared to prior CT. No enhancing components are seen, craniocaudal span is 5.3 cm. No additional cyst is seen. Paraspinal and other soft tissues: As above Disc levels: T11-12 small left paracentral herniation which is upward pointing, noncompressive. No significant facet spurring. 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 There is narrowing of the thecal sac with rightward cord displacement. Electronically Signed   By: Ronnette Coke M.D.   On: 01/21/2024 08:55        Assessment & Plan Visceral obesity  Degeneration of intervertebral disc of lumbar region, unspecified whether pain present  Hypothyroidism following radioiodine therapy  History of cholecystectomy  Suspected sleep apnea  BMI 37.0-37.9, adult  Cyst of spinal meninges  Low libido  Assessment and Plan Assessment & Plan    ED Discharge Orders     None     There are no diagnoses linked to this encounter.  Recommended follow up: No follow-ups on file.No future appointments.       Additional notes: This document was synthesized by artificial intelligence ([employer]) using HIPAA-compliant recording of the clinical interaction;   We discussed the use of AI scribe software for clinical note transcription with the patient, who gave verbal consent to proceed.    Additional Info: This encounter employed state-of-the-art, real-time,  collaborative documentation. The patient actively reviewed and assisted in updating their electronic medical record on a shared screen, ensuring transparency and facilitating joint problem-solving for the problem list, overview, and plan. This approach promotes accurate, informed care. The treatment plan was discussed and reviewed in detail, including medication safety, potential side effects, and all patient questions. We confirmed understanding and comfort with the plan. Follow-up instructions were established, including contacting the office for any concerns, returning if symptoms worsen, persist, or new symptoms develop, and precautions for potential emergency department visits.  Initial Appointment Goals:  This initial visit focused on establishing a foundation for the patient's care. We collaboratively reviewed her medical history and medications in detail, updating the chart as shown in the encounter. Given the extensive information, we prioritized addressing her most  pressing concerns, which she reported were: new pt (Pt is present for est care with pcp. Weight management / lab work.)  While the complexity of the patient's medical picture may necessitate further evaluation in subsequent visits, we were able to develop a preliminary care plan together. To expedite a comprehensive plan at the next visit, we encouraged the patient to gather relevant medical records from previous providers. This collaborative approach will ensure a more complete understanding of the patient's health and inform the development of a personalized care plan. We look forward to continuing the conversation and working together with the patient on achieving her health goals.   Collaborative Documentation:  Today's encounter utilized real-time, dynamic patient engagement.  Patients actively participate by directly reviewing and assisting in updating their medical records through a shared screen. This transparency empowers patients  to visually confirm chart updates made by the healthcare provider.  This collaborative approach facilitates problem management as we jointly update the problem list, problem overview, and assessment/plan. Ultimately, this process enhances chart accuracy and completeness, fostering shared decision-making, patient education, and informed consent for tests and treatments.  Collaborative Treatment Planning:  Treatment plans were discussed and reviewed in detail.  Explained medication safety and potential side effects.  Encouraged participation and answered all patient questions, confirming understanding and comfort with the plan. Encouraged patient to contact our office if they have any questions or concerns. Agreed on patient returning to office if symptoms worsen, persist, or new symptoms develop.  ----------------------------------------------------- Anthon Kins, MD  02/27/2024 3:06 PM  [facility]:  [phone]

## 2024-02-28 ENCOUNTER — Ambulatory Visit: Payer: Self-pay | Admitting: Internal Medicine

## 2024-02-28 ENCOUNTER — Encounter: Payer: Self-pay | Admitting: Internal Medicine

## 2024-02-28 NOTE — Assessment & Plan Note (Signed)
 She underwent cholecystectomy due to gallstones in the bile duct. No current issues related to this history.

## 2024-02-28 NOTE — Assessment & Plan Note (Signed)
 Hypothyroidism is secondary to previous treatment for Graves' disease with radioactive iodine. It is currently managed with levothyroxine 150 mcg daily. Thyroid function tests were normal as of April 2025. Continue levothyroxine 150 mcg daily.

## 2024-02-28 NOTE — Assessment & Plan Note (Signed)
 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 Monitor with follow-up imaging in two years as recommended by neurosurgery.

## 2024-02-28 NOTE — Assessment & Plan Note (Signed)
 She desires weight management and discussed options including Zepbound (tirzepatide) for weight loss. Although insurance does not cover Zepbound, it is considered the most effective with fewer side effects compared to alternatives. Alternative options such as bariatric surgery and generic weight loss drugs were discussed, noting their increased side effects and reduced efficacy. Explored purchasing tirzepatide through [employer] as a cost-effective option. Emphasized the importance of exercise to prevent muscle loss during weight loss. Consider purchasing tirzepatide through compounding pharmacies for cost savings. Encourage regular exercise to maintain muscle mass. Consider alternative weight loss options if tirzepatide is not feasible. Follow up in 1-3 months to reassess the weight management plan.  Gave extensive questionnaire to help with prior authorization for GLP-1 agonist which is medically necessary but difficult to get approved.  Body mass index is 37.46 kg/m.  Wt Readings from Last 10 Encounters:  02/27/24 235 lb 9.6 oz (106.9 kg)  04/03/23 235 lb (106.6 kg)  02/20/23 235 lb (106.6 kg)  01/26/23 235 lb 0.2 oz (106.6 kg)  01/18/23 235 lb 0.2 oz (106.6 kg)  01/11/23 230 lb (104.3 kg)  01/10/23 230 lb (104.3 kg)  12/11/22 230 lb (104.3 kg)  11/27/22 230 lb (104.3 kg)  11/04/18 212 lb (96.2 kg)

## 2024-02-28 NOTE — Assessment & Plan Note (Signed)
 She has a history of lumbar radiculopathy and degenerative disc disease, status post microdiscectomy in April 2024. Currently, she is 95% improved with no need for pain medication.

## 2024-02-28 NOTE — Assessment & Plan Note (Signed)
 Chronic GERD is managed with pantoprazole . Symptoms recur if pantoprazole  is discontinued. Continue pantoprazole  as needed for symptom control.

## 2024-03-15 ENCOUNTER — Telehealth: Admitting: Physician Assistant

## 2024-03-15 DIAGNOSIS — M545 Low back pain, unspecified: Secondary | ICD-10-CM

## 2024-03-15 MED ORDER — NAPROXEN 500 MG PO TABS
500.0000 mg | ORAL_TABLET | Freq: Two times a day (BID) | ORAL | 0 refills | Status: DC
Start: 2024-03-15 — End: 2024-04-06

## 2024-03-15 MED ORDER — CYCLOBENZAPRINE HCL 10 MG PO TABS
5.0000 mg | ORAL_TABLET | Freq: Three times a day (TID) | ORAL | 0 refills | Status: DC | PRN
Start: 2024-03-15 — End: 2024-04-06

## 2024-03-15 NOTE — Progress Notes (Signed)

## 2024-04-03 ENCOUNTER — Ambulatory Visit: Payer: Self-pay | Admitting: Internal Medicine

## 2024-04-03 ENCOUNTER — Ambulatory Visit (INDEPENDENT_AMBULATORY_CARE_PROVIDER_SITE_OTHER): Admitting: Internal Medicine

## 2024-04-03 ENCOUNTER — Encounter: Payer: Self-pay | Admitting: Internal Medicine

## 2024-04-03 VITALS — BP 120/70 | HR 91 | Temp 98.0°F | Ht 66.5 in | Wt 233.4 lb

## 2024-04-03 DIAGNOSIS — Z0001 Encounter for general adult medical examination with abnormal findings: Secondary | ICD-10-CM

## 2024-04-03 DIAGNOSIS — R92333 Mammographic heterogeneous density, bilateral breasts: Secondary | ICD-10-CM | POA: Diagnosis not present

## 2024-04-03 DIAGNOSIS — L989 Disorder of the skin and subcutaneous tissue, unspecified: Secondary | ICD-10-CM | POA: Insufficient documentation

## 2024-04-03 DIAGNOSIS — N393 Stress incontinence (female) (male): Secondary | ICD-10-CM

## 2024-04-03 DIAGNOSIS — Z6837 Body mass index (BMI) 37.0-37.9, adult: Secondary | ICD-10-CM

## 2024-04-03 DIAGNOSIS — D582 Other hemoglobinopathies: Secondary | ICD-10-CM

## 2024-04-03 DIAGNOSIS — Z23 Encounter for immunization: Secondary | ICD-10-CM

## 2024-04-03 DIAGNOSIS — M545 Low back pain, unspecified: Secondary | ICD-10-CM

## 2024-04-03 LAB — CBC WITH DIFFERENTIAL/PLATELET
Basophils Absolute: 0.1 10*3/uL (ref 0.0–0.1)
Basophils Relative: 1.1 % (ref 0.0–3.0)
Eosinophils Absolute: 0.2 10*3/uL (ref 0.0–0.7)
Eosinophils Relative: 3 % (ref 0.0–5.0)
HCT: 46.2 % — ABNORMAL HIGH (ref 36.0–46.0)
Hemoglobin: 15.6 g/dL — ABNORMAL HIGH (ref 12.0–15.0)
Lymphocytes Relative: 28.6 % (ref 12.0–46.0)
Lymphs Abs: 1.7 10*3/uL (ref 0.7–4.0)
MCHC: 33.8 g/dL (ref 30.0–36.0)
MCV: 91.3 fl (ref 78.0–100.0)
Monocytes Absolute: 0.5 10*3/uL (ref 0.1–1.0)
Monocytes Relative: 8.7 % (ref 3.0–12.0)
Neutro Abs: 3.5 10*3/uL (ref 1.4–7.7)
Neutrophils Relative %: 58.6 % (ref 43.0–77.0)
Platelets: 157 10*3/uL (ref 150.0–400.0)
RBC: 5.06 Mil/uL (ref 3.87–5.11)
RDW: 13.2 % (ref 11.5–15.5)
WBC: 5.9 10*3/uL (ref 4.0–10.5)

## 2024-04-03 LAB — COMPREHENSIVE METABOLIC PANEL WITH GFR
ALT: 16 U/L (ref 0–35)
AST: 19 U/L (ref 0–37)
Albumin: 4.4 g/dL (ref 3.5–5.2)
Alkaline Phosphatase: 76 U/L (ref 39–117)
BUN: 9 mg/dL (ref 6–23)
CO2: 29 meq/L (ref 19–32)
Calcium: 9.6 mg/dL (ref 8.4–10.5)
Chloride: 103 meq/L (ref 96–112)
Creatinine, Ser: 0.81 mg/dL (ref 0.40–1.20)
GFR: 85 mL/min (ref >60.00)
Glucose, Bld: 110 mg/dL — ABNORMAL HIGH (ref 70–99)
Potassium: 4.5 meq/L (ref 3.5–5.1)
Sodium: 138 meq/L (ref 135–145)
Total Bilirubin: 0.5 mg/dL (ref 0.2–1.2)
Total Protein: 7.7 g/dL (ref 6.0–8.3)

## 2024-04-03 LAB — LIPID PANEL
Cholesterol: 183 mg/dL (ref 0–200)
HDL: 65.7 mg/dL (ref >39.00)
LDL Cholesterol: 102 mg/dL — ABNORMAL HIGH (ref 0–99)
NonHDL: 117.59
Total CHOL/HDL Ratio: 3
Triglycerides: 79 mg/dL (ref 0.0–149.0)
VLDL: 15.8 mg/dL (ref 0.0–40.0)

## 2024-04-03 LAB — HEMOGLOBIN A1C: Hgb A1c MFr Bld: 5.5 % (ref 4.6–6.5)

## 2024-04-03 NOTE — Progress Notes (Signed)
 Most labs normal except rising hemoglobin- most consistent with obstructive sleep apnea.  We suspect this is getting worse.  Need to confirm and treat to preven worsening

## 2024-04-03 NOTE — Assessment & Plan Note (Signed)
 A skin lesion on the right forearm is suspected to be basal cell carcinoma, described as a tiny, pearly red papule with a shiny surface. Basal cell carcinomas grow slowly and take years to become life-threatening, but removal is recommended before it enlarges. Refer to dermatology for evaluation and removal of the skin lesion.

## 2024-04-03 NOTE — Progress Notes (Signed)
 [facility] at [facility] [address] [place], [place] [address] Office:  [phone]  -- Annual Preventive Medical Office Visit --  Patient:  Anna Taylor      Age: 48 y.o.       Sex:  female  Date:   04/03/2024 Patient Care Team: Anthon Kins, MD as PCP - General (Internal Medicine) Today's Healthcare Provider: Anthon Kins, MD  ========================================= Chief complaint: Annual Exam (Pt is present of cpe pt is fasting for labs,)  Purpose of Visit: Comprehensive preventive health assessment and personalized health maintenance planning.  This encounter was conducted as a Comprehensive Physical Exam (CPE) preventive care annual visit. The patient's medical history and problem list were reviewed to inform individualized preventive care recommendations.  No problem-specific medical treatment enough to warrant a separate additional service charge was provided during this visit.     Assessment & Plan Encounter for annual general medical examination with abnormal findings in adult She is up to date on most health screenings and vaccinations. Discussed the need for a tetanus shot and the shingles vaccine next year. She undergoes regular mammograms and has dense breast tissue with a negative biopsy. Emphasized consolidating medical records for comprehensive care. Administer tetanus shot. Request colonoscopy report from Dr. Tova Fresh and the facility on [facility]. Request mammogram records from OB GYN. Schedule shingles vaccine for next year. Skin lesion A skin lesion on the right forearm is suspected to be basal cell carcinoma, described as a tiny, pearly red papule with a shiny surface. Basal cell carcinomas grow slowly and take years to become life-threatening, but removal is recommended before it enlarges. Refer to dermatology for evaluation and removal of the skin lesion. Heterogeneously dense tissue of both breasts on mammography  BMI  37.0-37.9, adult She is considering bariatric surgery due to the high cost of weight loss medications. Surgery offers protection against cardiovascular disease, diabetes, and certain cancers. With a BMI of 37, she barely meets the criteria for surgery and has been qualified since 2017. Risks include infection, bleeding, and rare mortality. Surgery might lead to insurance covering weight loss medication instead. Both surgery and medication offer comparable benefits, but surgery carries more risk. Weight loss clinics may provide medication at a reduced cost. Set up a referral for bariatric surgery. Order blood work including diabetes and cholesterol screening, blood count, metabolic panel, and thyroid function tests. SUI (stress urinary incontinence, female) She experiences occasional stress incontinence, particularly when laughing, sneezing, or coughing. Explained the difference between stress and urge incontinence and recommended Kegel exercises. The use of a pessary was discussed but deemed uncomfortable and generally undesirable. Perform urinalysis to rule out urinary tract infection. Recommend Kegel exercises. Immunization due Tetanus, Diphtheria, and Pertussis (Tdap) given. Encounter for general adult medical examination with abnormal findings  Need for vaccine for DT (diphtheria-tetanus)    Diagnoses and all orders for this visit: Encounter for annual general medical examination with abnormal findings in adult Skin lesion Heterogeneously dense tissue of both breasts on mammography BMI 37.0-37.9, adult -     Amb Referral to Bariatric Surgery -     Lipid panel -     Comprehensive metabolic panel with GFR -     CBC with Differential/Platelet -     TSH Rfx on Abnormal to Free T4 -     HgB A1c SUI (stress urinary incontinence, female) -     Urinalysis w microscopic + reflex cultur Immunization due -     Tdap  vaccine greater than or equal to 7yo IM Encounter for general adult medical  examination with abnormal findings Need for vaccine for DT (diphtheria-tetanus)   ORDERS:    Orders Placed This Encounter  Procedures   Tdap vaccine greater than or equal to 7yo IM   Lipid panel    [place]    Has the patient fasted?:   No    Release to patient:   Immediate [1]   Comprehensive metabolic panel with GFR    Has the patient fasted?:   No    Release to patient:   Immediate [1]   CBC with Differential/Platelet    Release to patient:   Immediate [1]   TSH Rfx on Abnormal to Free T4   HgB A1c   Urinalysis w microscopic + reflex cultur   Amb Referral to Bariatric Surgery    Referral Priority:   Routine    Referral Type:   Consultation    Number of Visits Requested:   1   No orders of the defined types were placed in this encounter.      All of the following was reviewed with patient during today's Comprehensive Physical Exam (CPE) preventive care annual visit: HEALTH MAINTENANCE COUNSELING AND ANTICIPATORY GUIDANCE  Reviewed the following verbally with patient and provided AVS materials: Preventive Measure Recommendation  Eye Exams Every 1-2 years  Dental Care Cleanings every 6 months or more, brush/floss 3x daily  Sinus Care Saline spray rinses daily encouraged   Sleep 8 hours nightly, good sleep hygiene, e-monitoring if any daytime drowsiness  Diet Fruits/vegetables/fiber/healthy fats, balance and moderation  Exercise 150 minutes weekly  Risk Behaviors Discouraged any/all high risk behaviors   Bone Health Recommended to maintain a good source of calcium and vitamin D in diet.  She denies any personal history of early osteoporosis or fragility fractures []   For Bone Density [facility]  [address] [place], [place]  [phone]  Anticipatory Counseling Recommend absolute abstinence from all vaped or smoked recreational or illicit substances of abuse such as tobacco, nicotine, alcohol, illicit drugs, even sugar.   Safe driving / no text and  driving.  Health Maintenance Health Maintenance Due  Topic Date Due   Colonoscopy  Never done  She agreed to  Health Maintenance  Topic Date Due   Colonoscopy  Never done   COVID-19 Vaccine (6 - 2024-25 season) 04/19/2024 (Originally 06/17/2023)   Cervical Cancer Screening (HPV/Pap Cotest)  02/26/2025 (Originally 06/10/2004)   Hepatitis C Screening  02/26/2025 (Originally 06/10/1992)   HIV Screening  02/26/2025 (Originally 06/10/1989)   INFLUENZA VACCINE  05/16/2024   DTaP/Tdap/Td (3 - Td or Tdap) 04/03/2034   HPV VACCINES  Aged Out   Meningococcal B Vaccine  Aged Out  Reviewed/Encouraged completion.  Immunizations Immunization History  Administered Date(s) Administered   Influenza Inj Mdck Quad Pf 06/29/2018, 10/20/2022   Influenza,inj,Quad PF,6+ Mos 07/10/2019   PFIZER(Purple Top)SARS-COV-2 Vaccination 12/12/2019, 01/03/2020, 09/16/2020   Pfizer Covid-19 Vaccine Bivalent Booster 51yrs & up 07/28/2021   Pfizer(Comirnaty)Fall Seasonal Vaccine 12 years and older 10/20/2022   Td 03/16/2004   Tdap 04/03/2024  Reviewed.   CANCER SCREENING SHARED DECISION MAKING   Colon HM Colonoscopy          Current Care Gaps     Colonoscopy (Every 10 Years) Never done   2 years ago roughly she reports having - will request report.           .  Health Maintenance  Topic Date Due   Colonoscopy  Never done   COVID-19 Vaccine (6 - 2024-25 season) 04/19/2024 (Originally 06/17/2023)   Cervical Cancer Screening (HPV/Pap Cotest)  02/26/2025 (Originally 06/10/2004)   Hepatitis C Screening  02/26/2025 (Originally 06/10/1992)   HIV Screening  02/26/2025 (Originally 06/10/1989)   INFLUENZA VACCINE  05/16/2024   DTaP/Tdap/Td (3 - Td or Tdap) 04/03/2034   HPV VACCINES  Aged Out   Meningococcal B Vaccine  Aged Out   Inquired about any gastrointestinal bleeding/family history   Lung Current guidelines recommend individuals aged 46 to 34 who currently smoke or formerly smoked and have a >= 20 pack-year  smoking history should undergo annual screening with low-dose computed tomography (LDCT). Tobacco Use: Low Risk  (04/03/2024)   Patient History    Smoking Tobacco Use: Never    Smokeless Tobacco Use: Never    Passive Exposure: Not on file    Skin Advised regular sunscreen use. Patient denies worrisome, changing, or new skin lesions. Offered to include images in chart for surveillance. Showed patient these pictures of melanomas for reference to educate for self-monitoring.  Gynecological No known family history of breast cancer. No Cervical Cancer Screening results to display. HM Mammogram   This patient has no relevant Health Maintenance data. She reports having at [facility] obstetrics/gynecology    No results found. Cervical, breast, uterine, and ovarian cancer screenings require a dedicated well-woman exam with a gynecology specialist which were not included in today's annual primary care preventive visit.  Safe sex is encouraged, but routine urinalysis sexual transmitted infection screenings in asymptomatic patients are not recommended per guidelines.  Patient verbally acknowledged understanding and intent to maintain routine gynecology appointment(s).   Please call and schedule appointment: []   For Mammogram: [facility]      [address] [place], [place]       [phone]         Thyroid Palpated thyroid -no nodules of concern(s).  Other Cancers Discussed lack of screening guidelines and insurance coverage for other cancer types.    Discussed the use of AI scribe software for clinical note transcription with the patient, who gave verbal consent to proceed.  History of Present Illness  47 year old female who presents for a routine examination.  She experiences occasional stress urinary incontinence, particularly when laughing, sneezing, or coughing.  She has a skin lesion on her right forearm, described as a tiny, pearly red, raised papule with a  shiny surface. She mentions having shaved it off before, but it has recurred.  She has a history of yearly mammograms due to heterogeneously dense breasts and a family history of breast cancer, with a cousin having had the disease. She has also had a negative breast biopsy in the past.  She has undergone two colonoscopies in the past, with the first revealing polyps. The last colonoscopy was approximately two years ago, but she is unsure of the exact date.  She is concerned about her weight and has been considering weight loss options. She mentions that weight loss medication is too expensive for her, and she is exploring surgical options.  No hearing problems, chest pain, GI bleeding, bone loss, or symptoms of melanoma.   BMI Readings from Last 30 Encounters:  04/03/24 37.11 kg/m  02/27/24 37.46 kg/m  04/03/23 37.36 kg/m  02/20/23 37.36 kg/m  01/26/23 37.36 kg/m  01/18/23 37.36 kg/m  01/11/23 36.57 kg/m  01/10/23 37.12 kg/m  12/11/22 37.12  kg/m  11/27/22 37.12 kg/m  11/04/18 33.71 kg/m  09/10/18 34.46 kg/m  08/16/18 34.46 kg/m  07/06/18 35.24 kg/m  06/01/16 35.51 kg/m  07/04/15 32.50 kg/m    Review of Systems  Constitutional:  Negative for chills, diaphoresis, fever, malaise/fatigue and weight loss.  HENT:  Negative for congestion, ear discharge, ear pain, hearing loss, nosebleeds, sinus pain, sore throat and tinnitus.   Eyes:  Negative for blurred vision, double vision, photophobia, pain, discharge and redness.  Respiratory:  Negative for cough, hemoptysis, sputum production, shortness of breath, wheezing and stridor.   Cardiovascular:  Negative for chest pain, palpitations, orthopnea, claudication, leg swelling and PND.  Gastrointestinal:  Negative for abdominal pain, blood in stool, constipation, diarrhea, heartburn, melena, nausea and vomiting.  Genitourinary:  Positive for urgency (occasional stress incontinence). Negative for dysuria, flank pain, frequency  and hematuria.  Musculoskeletal:  Negative for back pain, falls, joint pain, myalgias and neck pain.  Skin:  Negative for itching and rash.  Neurological:  Negative for dizziness, tingling, tremors, sensory change, speech change, focal weakness, seizures, loss of consciousness, weakness and headaches.  Endo/Heme/Allergies:  Negative for environmental allergies and polydipsia. Does not bruise/bleed easily.  Psychiatric/Behavioral:  Negative for depression, hallucinations, memory loss, substance abuse and suicidal ideas. The patient is not nervous/anxious and does not have insomnia.    A comprehensive ROS was negative for any concerning symptoms.   Completed medication reconciliation: Current Outpatient Medications on File Prior to Visit  Medication Sig   cyclobenzaprine  (FLEXERIL ) 10 MG tablet Take 0.5-1 tablets (5-10 mg total) by mouth 3 (three) times daily as needed.   levothyroxine (SYNTHROID, LEVOTHROID) 150 MCG tablet Take 150 mcg by mouth daily.   naproxen  (NAPROSYN ) 500 MG tablet Take 1 tablet (500 mg total) by mouth 2 (two) times daily with a meal.   pantoprazole  (PROTONIX ) 40 MG tablet TAKE 1 TABLET BY MOUTH EVERY DAY   No current facility-administered medications on file prior to visit.   Medications Discontinued During This Encounter  Medication Reason   tirzepatide  (ZEPBOUND ) 2.5 MG/0.5ML Pen    Levothyroxine Sodium 150 MCG CAPS    pantoprazole  (PROTONIX ) 40 MG tablet    NEEDLE, DISP, 30 G (BD ECLIPSE LUER-LOK NEEDLE) 30G X 1/2 MISC    Syringe/Needle, Disp, 20G X 1 1 ML MISC    tirzepatide  10 MG/0.5ML injection vial   The following were reviewed and/or entered/updated into our electronic MEDICAL RECORD NUMBERPast Medical History:  Diagnosis Date   Anxiety    Arthritis    Atypical squamous cells of undetermined significance (ASCUS) on Papanicolaou smear of cervix 05/15/2022   Depression    GERD (gastroesophageal reflux disease)    High-risk human papillomavirus (HPV) DNA  detected in cervical specimen 03/23/2022   History of cholecystectomy 02/27/2024   Due to excessive gallstones     Hypothyroidism    Irritable bowel syndrome (IBS)    Neuromuscular disorder (HCC)    Thyroid disease    Past Surgical History:  Procedure Laterality Date   bunion removal Right    CHOLECYSTECTOMY     COLONOSCOPY     LUMBAR LAMINECTOMY/DECOMPRESSION MICRODISCECTOMY Left 01/26/2023   Procedure: LEFT L5-S1 MICRODISCECTOMY;  Surgeon: Jodeen Munch, MD;  Location: [facility] ORS;  Service: Neurosurgery;  Laterality: Left;   WISDOM TOOTH EXTRACTION     Social History   Socioeconomic History   Marital status: Single    Spouse name: Athena Bland   Number of children: 3   Years of education: Not on file  Highest education level: Bachelor's degree (e.g., BA, AB, BS)  Occupational History   Not on file  Tobacco Use   Smoking status: Never   Smokeless tobacco: Never  Vaping Use   Vaping status: Never Used  Substance and Sexual Activity   Alcohol use: Yes    Comment: occasional wine   Drug use: No   Sexual activity: Yes    Partners: Male  Other Topics Concern   Not on file  Social History Narrative   Not on file   Social Drivers of Health   Financial Resource Strain: Patient Declined (04/02/2024)   Overall Financial Resource Strain (CARDIA)    Difficulty of Paying Living Expenses: Patient declined  Food Insecurity: Patient Declined (04/02/2024)   Hunger Vital Sign    Worried About Running Out of Food in the Last Year: Patient declined    Ran Out of Food in the Last Year: Patient declined  Transportation Needs: No Transportation Needs (04/02/2024)   PRAPARE - Administrator, Civil Service (Medical): No    Lack of Transportation (Non-Medical): No  Physical Activity: Insufficiently Active (04/02/2024)   Exercise Vital Sign    Days of Exercise per Week: 2 days    Minutes of Exercise per Session: 60 min  Stress: No Stress Concern Present (04/02/2024)   [employer] of [employer]ational [employer] - Occupational Stress Questionnaire    Feeling of Stress: Not at all  Social Connections: Unknown (04/02/2024)   Social Connection and Isolation Panel    Frequency of Communication with Friends and Family: Three times a week    Frequency of Social Gatherings with Friends and Family: Once a week    Attends Religious Services: More than 4 times per year    Active Member of [employer] or Organizations: Patient declined    Attends Banker Meetings: Not on file    Marital Status: Divorced  Intimate Partner Violence: Unknown (05/18/2022)   Received from [facility]   HITS    Physically Hurt: Not on file    Insult or Talk Down To: Not on file    Threaten Physical Harm: Not on file    Scream or Curse: Not on file      04/02/2024   12:06 PM  Alcohol Use Disorder Test (AUDIT)  1. How often do you have a drink containing alcohol? 2  2. How many drinks containing alcohol do you have on a typical day when you are drinking? 1  3. How often do you have six or more drinks on one occasion? 0  AUDIT-C Score 3   4. How often during the last year have you found that you were not able to stop drinking once you had started? 0  5. How often during the last year have you failed to do what was normally expected from you because of drinking? 0  6. How often during the last year have you needed a first drink in the morning to get yourself going after a heavy drinking session? 0  7. How often during the last year have you had a feeling of guilt of remorse after drinking? 0  8. How often during the last year have you been unable to remember what happened the night before because you had been drinking? 0  9. Have you or someone else been injured as a result of your drinking? 0  10. Has a relative or friend or a doctor or another health worker been concerned about your drinking or suggested  you cut down? 0  Alcohol Use Disorder Identification Test Final Score (AUDIT) 3       Patient-reported   Family History  Problem Relation Age of Onset   Prostate cancer Father    Colon polyps Father    Heart disease Maternal Grandfather    Heart disease Paternal Grandmother    Heart disease Paternal Grandfather    Colon cancer Neg Hx    Esophageal cancer Neg Hx    Rectal cancer Neg Hx    Stomach cancer Neg Hx    Allergies  Allergen Reactions   Levaquin [Levofloxacin In D5w] Other (See Comments)    Sunburn stinging   Social History   Substance and Sexual Activity  Sexual Activity Yes   Partners: Male   Social History   Tobacco Use   Smoking status: Never   Smokeless tobacco: Never  Vaping Use   Vaping status: Never Used  Substance Use Topics   Alcohol use: Yes    Comment: occasional wine   Drug use: No      04/03/2024    9:26 AM  Depression screen PHQ 2/9  Decreased Interest 0  Down, Depressed, Hopeless 0  PHQ - 2 Score 0  Altered sleeping 0  Tired, decreased energy 0  Change in appetite 0  Feeling bad or failure about yourself  0  Trouble concentrating 0  Moving slowly or fidgety/restless 0  Suicidal thoughts 0  PHQ-9 Score 0  Difficult doing work/chores Not difficult at all       No data to display           BP 120/70   Pulse 91   Temp 98 F (36.7 C) (Temporal)   Ht 5' 6.5 (1.689 m)   Wt 233 lb 6.4 oz (105.9 kg)   SpO2 98%   BMI 37.11 kg/m  BP Readings from Last 3 Encounters:  04/03/24 120/70  02/27/24 132/74  04/03/23 118/72   Wt Readings from Last 10 Encounters:  04/03/24 233 lb 6.4 oz (105.9 kg)  02/27/24 235 lb 9.6 oz (106.9 kg)  04/03/23 235 lb (106.6 kg)  02/20/23 235 lb (106.6 kg)  01/26/23 235 lb 0.2 oz (106.6 kg)  01/18/23 235 lb 0.2 oz (106.6 kg)  01/11/23 230 lb (104.3 kg)  01/10/23 230 lb (104.3 kg)  12/11/22 230 lb (104.3 kg)  11/27/22 230 lb (104.3 kg)  Physical Exam Verbalized:  Physical Exam MEASUREMENTS: BMI- 37.0. HEENT: Normal oropharynx, hearing grossly intact. CARDIOVASCULAR: Normal heart  sounds. SKIN: Pearly red papule on right forearm with shiny surface.  Truncal adiposity  GEN: No acute distress, resting comfortably. HEENT: Tympanic membranes normal appearing bilaterally, oropharynx clear, no thyromegaly noted, no palpable lymphadenopathy or thyroid nodules. CARDIOVASCULAR: S1 and S2 heart sounds with regular rate and rhythm, no murmurs appreciated. PULMONARY: Normal work of breathing, clear to auscultation bilaterally, no crackles, wheezes, or rhonchi. ABDOMEN: Soft, nontender, nondistended. MSK: No edema, cyanosis, or clubbing noted. SKIN: Warm, dry, no lesions of concern observed. NEUROLOGICAL: Cranial nerves II-XII grossly intact, strength 5/5 in upper and lower extremities, reflexes symmetric and intact bilaterally. PSYCH: Normal affect and thought content, pleasant and cooperative.      ======================================  Notes:  This document was synthesized by artificial intelligence (Abridge) using HIPAA-compliant recording of the clinical interaction;   We discussed the use of AI scribe software for clinical note transcription with the patient, who gave verbal consent to proceed.    This encounter employed state-of-the-art, real-time, collaborative documentation. The patient was  empowered to actively review and assist in updating their electronic medical record on a shared monitor, ensuring transparency and improving accuracy.    Prior to and at the beginning of Comprehensive Physical Exam (CPE) preventive care annual visit appointment types  we clarify to patients Our goal today is to focus on your preventive or annual Comprehensive Physical Exam (CPE) preventive care annual visit, which typically covers routine screenings and overall health maintenance. However, if you share any new or concerning symptoms--such as dizziness, passing out, severe pain, or anything else that may point to a more serious issue--we are both legally and ethically required to  evaluate it. We cannot simply overlook or ignore such concerns, even if you later decide you don't want to discuss them, because it could jeopardize your health.  If addressing a new concern takes us  beyond the scope of the preventive visit, we may need to bill separately for that portion of care. We understand financial considerations are important, and we're happy to discuss your options if something new comes up. However, we want to be clear that once you mention a potentially serious issue, we must investigate it; we can't ethically or legally exclude that from our records or our evaluation. Please let us  know all of your questions or worries. Together, we can decide how best to manage them and how to minimize any unexpected costs, but we want to keep you safe above all else.   This disclosure is mandated by professional ethics and legal obligations, as healthcare providers must address any substantial health concerns raised during any patient interaction and a comprehensive ROS is required by insurance companies for billing preventive-care visit type.   This disclosure ultimately discourages patients financially from reporting significant health issues.  In addition to this disclaimer, all orders placed during this encounter were agreed upon after shared decision making, with no guarantees of insurance coverage able to be provided.    IMPORTANT HEALTH REMINDERS: Report any new or changing skin lesions promptly Maintain recommended screening schedules Discuss any new family history of cancer at future visits Follow up on any new symptoms that persist more than two weeks    Medical Screening Exam A medical screening exam (MSE) helps to determine whether you need immediate medical treatment relating to any number of symptoms you are having. This type of exam may be done in an emergency department, an urgent care setting, or your health care provider's office. Depending on your symptoms and severity,  you may need additional tests or medical therapy. It is important to note that an MSE does not necessarily mean that you will need or receive further medical testing or interventions if your symptoms are not deemed to be medically urgent (emergent). Tell a health care provider about: Any allergies you have. All medicines you are taking, including vitamins, herbs, eye drops, creams, and over-the-counter medicines. Any problems you or family members have had with anesthetic medicines. Any bleeding problems you have. Any surgeries you have had. Any medical conditions you have. Whether you are pregnant or may be pregnant. What happens during the test? During the exam, a health care provider does a short, often focused, physical exam and asks about your medical history to assess: Your current symptoms. Your overall health. Your need for possible further medical intervention. What can I expect after the test? If you have a regular health care provider, make an appointment for a follow-up visit with him or her. If you do not have a regular health care  provider, ask about resources in your community. Your medical screening exam may determine that: You do not need emergency treatment at this time. You need treatment right away. You need to be transferred to another medical center. This may happen if you need an emergent specialist or consultant that is not available at the medical center you are at. You need to have more tests. A medical specialist may be consulted if needed. Get help right away if: Your condition gets worse. You develop new or troubling symptoms before you see your health care provider. These symptoms may represent a serious problem that is an emergency. Do not wait to see if the symptoms will go away. Get medical help right away. Call your local emergency services (911 in the [place] Do not drive yourself to the hospital. Summary A medical screening exam helps to determine  whether you need medical treatment right away. This type of exam may be done in an emergency department, an urgent care setting, or your health care provider's office. During the exam, a health care provider does a short physical exam and asks about your current symptoms and overall health. Depending on the exam, more tests or therapies may be ordered. However, an MSE does not necessarily mean that you will have further medical testing if your symptoms are not deemed to be urgent. If you need further care that is not offered at your current medical center, you may need to be transferred to another facility. This information is not intended to replace advice given to you by your health care provider. Make sure you discuss any questions you have with your health care provider. Document Revised: 06/15/2021 Document Reviewed: 02/10/2021 Elsevier Patient Education  2024 [employer]  Health Maintenance, Female Adopting a healthy lifestyle and getting preventive care are important in promoting health and wellness. Ask your health care provider about: The right schedule for you to have regular tests and exams. Things you can do on your own to prevent diseases and keep yourself healthy. What should I know about diet, weight, and exercise? Eat a healthy diet  Eat a diet that includes plenty of vegetables, fruits, low-fat dairy products, and lean protein. Do not eat a lot of foods that are high in solid fats, added sugars, or sodium. Maintain a healthy weight Body mass index (BMI) is used to identify weight problems. It estimates body fat based on height and weight. Your health care provider can help determine your BMI and help you achieve or maintain a healthy weight. Get regular exercise Get regular exercise. This is one of the most important things you can do for your health. Most adults should: Exercise for at least 150 minutes each week. The exercise should increase your heart rate and make you  sweat (moderate-intensity exercise). Do strengthening exercises at least twice a week. This is in addition to the moderate-intensity exercise. Spend less time sitting. Even light physical activity can be beneficial. Watch cholesterol and blood lipids Have your blood tested for lipids and cholesterol at 50 years of age, then have this test every 5 years. Have your cholesterol levels checked more often if: Your lipid or cholesterol levels are high. You are older than 50 years of age. You are at high risk for heart disease. What should I know about cancer screening? Depending on your health history and family history, you may need to have cancer screening at various ages. This may include screening for: Breast cancer. Cervical cancer. Colorectal cancer. Skin cancer. Lung cancer.  What should I know about heart disease, diabetes, and high blood pressure? Blood pressure and heart disease High blood pressure causes heart disease and increases the risk of stroke. This is more likely to develop in people who have high blood pressure readings or are overweight. Have your blood pressure checked: Every 3-5 years if you are 65-52 years of age. Every year if you are 33 years old or older. Diabetes Have regular diabetes screenings. This checks your fasting blood sugar level. Have the screening done: Once every three years after age 19 if you are at a normal weight and have a low risk for diabetes. More often and at a younger age if you are overweight or have a high risk for diabetes. What should I know about preventing infection? Hepatitis B If you have a higher risk for hepatitis B, you should be screened for this virus. Talk with your health care provider to find out if you are at risk for hepatitis B infection. Hepatitis C Testing is recommended for: Everyone born from [birth year] through 1967. Anyone with known risk factors for hepatitis C. Sexually transmitted infections (STIs) Get screened for  STIs, including gonorrhea and chlamydia, if: You are sexually active and are younger than 50 years of age. You are older than 50 years of age and your health care provider tells you that you are at risk for this type of infection. Your sexual activity has changed since you were last screened, and you are at increased risk for chlamydia or gonorrhea. Ask your health care provider if you are at risk. Ask your health care provider about whether you are at high risk for HIV. Your health care provider may recommend a prescription medicine to help prevent HIV infection. If you choose to take medicine to prevent HIV, you should first get tested for HIV. You should then be tested every 3 months for as long as you are taking the medicine. Pregnancy If you are about to stop having your period (premenopausal) and you may become pregnant, seek counseling before you get pregnant. Take 400 to 800 micrograms (mcg) of folic acid every day if you become pregnant. Ask for birth control (contraception) if you want to prevent pregnancy. Osteoporosis and menopause Osteoporosis is a disease in which the bones lose minerals and strength with aging. This can result in bone fractures. If you are 28 years old or older, or if you are at risk for osteoporosis and fractures, ask your health care provider if you should: Be screened for bone loss. Take a calcium or vitamin D supplement to lower your risk of fractures. Be given hormone replacement therapy (HRT) to treat symptoms of menopause. Follow these instructions at home: Alcohol use Do not drink alcohol if: Your health care provider tells you not to drink. You are pregnant, may be pregnant, or are planning to become pregnant. If you drink alcohol: Limit how much you have to: 0-1 drink a day. Know how much alcohol is in your drink. In the [place] one drink equals one 12 oz bottle of beer (355 mL), one 5 oz glass of wine (148 mL), or one 1 oz glass of hard liquor (44  mL). Lifestyle Do not use any products that contain nicotine or tobacco. These products include cigarettes, chewing tobacco, and vaping devices, such as e-cigarettes. If you need help quitting, ask your health care provider. Do not use street drugs. Do not share needles. Ask your health care provider for help if you need support or information  about quitting drugs. General instructions Schedule regular health, dental, and eye exams. Stay current with your vaccines. Tell your health care provider if: You often feel depressed. You have ever been abused or do not feel safe at home. Summary Adopting a healthy lifestyle and getting preventive care are important in promoting health and wellness. Follow your health care provider's instructions about healthy diet, exercising, and getting tested or screened for diseases. Follow your health care provider's instructions on monitoring your cholesterol and blood pressure. This information is not intended to replace advice given to you by your health care provider. Make sure you discuss any questions you have with your health care provider. Document Revised: 02/21/2021 Document Reviewed: 02/21/2021 Elsevier Patient Education  2024 [employer].

## 2024-04-03 NOTE — Assessment & Plan Note (Signed)
 She is considering bariatric surgery due to the high cost of weight loss medications. Surgery offers protection against cardiovascular disease, diabetes, and certain cancers. With a BMI of 37, she barely meets the criteria for surgery and has been qualified since 2017. Risks include infection, bleeding, and rare mortality. Surgery might lead to insurance covering weight loss medication instead. Both surgery and medication offer comparable benefits, but surgery carries more risk. Weight loss clinics may provide medication at a reduced cost. Set up a referral for bariatric surgery. Order blood work including diabetes and cholesterol screening, blood count, metabolic panel, and thyroid function tests.

## 2024-04-03 NOTE — Patient Instructions (Addendum)
 Building Your Long-Term Health Plan  During today's preventive visit, we covered a variety of important health checks to help you stay on top of your well-being.  We also discussed strategies to maintain your health and identified some areas that might benefit from further exploration.   Preventive care visits like today's are designed to be proactive, but sometimes additional attention may be needed.  Rest assured, we're here for you.  If these areas require further evaluation or management, we'd be happy to schedule a separate, focused appointment to address them in detail.  Addressing Next Steps  [x]   Follow-up Visit: To ensure we address any unresolved issues and continue monitoring your overall health, we recommend scheduling a follow-up appointment in 1 year for your next preventive care visit. If you experience any new problems, need to discuss any medical concerns, or your condition worsens before then, please don't hesitate to call our office to schedule an appointment or seek emergency care as needed.  [x]   Preventive Measures: Maintaining healthy habits plays a crucial role in overall wellness. We recommend considering these tips: [x]   Regular appointments with dental and vision professionals [x]   Nightly nasal saline mist to keep sinuses clear [x]   Consistent toothbrushing to maintain oral health [x]   Using an app like SnoreLab to track sleep quality [x]   Routine checks of blood pressure and heart rate [x]   Medical Information: In some instances, we may require additional medical information from other providers to create a comprehensive picture of your health. If applicable, we can provide a medical information release form at the front desk for you to sign, allowing us  to gather these records. [x]   Lab Tests: If any lab tests were ordered today, scheduling them within a week of your visit helps ensure the best possible insurance coverage.  Planning Follow Up to Work on a Problem? Make  the Most of Our Focused (20 minute) Appointments  [x]   Clearly state your top concerns at the beginning of the visit to focus our discussion [x]   If you anticipate you will need more time, please inform the front desk during scheduling - we can book multiple appointments in the same week. [x]   If you have transportation problems- use our convenient video appointments or ask about transportation support. [x]   We can get down to business faster if you use MyChart to update information before the visit and submit non-urgent questions before your visit. Thank you for taking the time to provide details through MyChart.  Let our nurse know and she can import this information into your encounter documents.  Arrival and Wait Times  [x]   Arriving on time ensures that everyone receives prompt attention. [x]   Early morning (8a) and afternoon (1p) appointments tend to have shortest wait times. [x]   Unfortunately, we cannot delay appointments for late arrivals or hold slots during phone calls.  Bring to Your Next Appointment:  [x]   Medications: Please bring all your medication bottles to your next appointment to ensure we have an accurate record of your prescriptions. [x]   Health Diaries: If you're monitoring any health conditions at home, keeping a diary of your readings can be very helpful for discussions at your next appointment.  Reviewing Your Records  [x]   Review your attached preventive care information at the end of these patient instructions. [x]   Review this early draft of your clinical encounter notes below and the final encounter summary tomorrow on MyChart after its been completed.      Getting Answers and  Following Up  [x]   Simple Questions & Concerns: For quick questions or basic follow-up after your visit, reach us  at [phone] or MyChart messaging. [x]   Complex Concerns: If your concern is more complex, scheduling an appointment might be best. Discuss this with the staff to find  the most suitable option. [x]   Lab & Imaging Results: We'll contact you directly if results are abnormal or you don't use MyChart. Most normal results will be on MyChart within 2-3 business days, with a review message from Dr. Boston Byers. Haven't heard back in 2 weeks? Need results sooner? Contact us  at [phone]. [x]   Referrals: Our referral coordinator will manage specialist referrals. The specialist's office should contact you within 2 weeks to schedule an appointment. Call us  if you haven't heard from them after 2 weeks.  Staying Connected  [x]   MyChart: Activate your MyChart for the fastest way to access results and message us . See the last page of this paperwork for instructions on how to activate.  Billing  [x]   X-ray & Lab Orders: These are billed by separate companies. Contact the invoicing company directly for questions or concerns. [x]   Visit Charges: Discuss any billing inquiries with our administrative services team.  Your Satisfaction Matters  [x]   Share Your Experience: We strive for your satisfaction! If you have any complaints, or preferably compliments, please let Dr. Boston Byers know directly or contact our Practice Administrators, Olinda Bertrand or [employer], by asking at the front desk.                 Next Steps  [x]   Schedule Follow-Up:  We recommend a follow-up appointment in 1 year for your next wellness visit.  If you develop any new problems, want to address any medical issues, or your condition worsens before then, please call us  for an appointment or seek emergency care. [x]   Preventive Care:  Make sure to keep regular appointments with dental and vision professionals, use nightly nasal saline mist sprays to keep your sinuses clear and toothbrushing to protect your teeth. Use SnoreLab App or other app to track your sleep quality. Check blood pressure and heart rate routinely. [x]   Medical Information Release:  For any relevant medical information we  don't have, please sign a release form at the front desk so we can obtain it for your records. [x]   Lab Tests:  Schedule any lab tests from today for within a week to ensure best insurance coverage.    Making the Most of Our Focused (20 minute) Appointments:  [x]   Clearly state your top concerns at the beginning of the visit to focus our discussion [x]   If you anticipate you will need more time, please inform the front desk during scheduling - we can book multiple appointments in the same week. [x]   If you have transportation problems- use our convenient video appointments or ask about transportation support. [x]   We can get down to business faster if you use MyChart to update information before the visit and submit non-urgent questions before your visit. Thank you for taking the time to provide details through MyChart.  Let our nurse know and she can import this information into your encounter documents.  Arrival and Wait Times: [x]   Arriving on time ensures that everyone receives prompt attention. [x]   Early morning (8a) and afternoon (1p) appointments tend to have shortest wait times. [x]   Unfortunately, we cannot delay appointments for late arrivals or hold slots during phone calls.  Bring to Your  Next Appointment  [x]   Medications: Please bring all your medication bottles to your next appointment to ensure we have an accurate record of your prescriptions. [x]   Health Diaries: If you're monitoring any health conditions at home, keeping a diary of your readings can be very helpful for discussions at your next appointment.  Reviewing Your Records  [x]   Review your attached preventive care information at the end of these patient instructions. [x]   Review this early draft of your clinical encounter notes below and the final encounter summary tomorrow on MyChart after its been completed.   Encounter for annual general medical examination with abnormal findings in adult  Skin  lesion Assessment & Plan: A skin lesion on the right forearm is suspected to be basal cell carcinoma, described as a tiny, pearly red papule with a shiny surface. Basal cell carcinomas grow slowly and take years to become life-threatening, but removal is recommended before it enlarges. Refer to dermatology for evaluation and removal of the skin lesion.   Heterogeneously dense tissue of both breasts on mammography  BMI 37.0-37.9, adult Assessment & Plan: She is considering bariatric surgery due to the high cost of weight loss medications. Surgery offers protection against cardiovascular disease, diabetes, and certain cancers. With a BMI of 37, she barely meets the criteria for surgery and has been qualified since 2017. Risks include infection, bleeding, and rare mortality. Surgery might lead to insurance covering weight loss medication instead. Both surgery and medication offer comparable benefits, but surgery carries more risk. Weight loss clinics may provide medication at a reduced cost. Set up a referral for bariatric surgery. Order blood work including diabetes and cholesterol screening, blood count, metabolic panel, and thyroid function tests.  Orders: -     Amb Referral to Bariatric Surgery -     Lipid panel -     Comprehensive metabolic panel with GFR -     CBC with Differential/Platelet -     TSH Rfx on Abnormal to Free T4 -     Hemoglobin A1c  SUI (stress urinary incontinence, female) -     Urinalysis w microscopic + reflex cultur  Immunization due -     Tdap vaccine greater than or equal to 7yo IM  Encounter for general adult medical examination with abnormal findings  Need for vaccine for DT (diphtheria-tetanus)     Getting Answers and Following Up  [x]   Simple Questions & Concerns: For quick questions or basic follow-up after your visit, reach us  at [phone] or MyChart messaging. [x]   Complex Concerns: If your concern is more complex, scheduling an appointment might  be best. Discuss this with the staff to find the most suitable option. [x]   Lab & Imaging Results: We'll contact you directly if results are abnormal or you don't use MyChart. Most normal results will be on MyChart within 2-3 business days, with a review message from Dr. Boston Byers. Haven't heard back in 2 weeks? Need results sooner? Contact us  at [phone]. [x]   Referrals: Our referral coordinator will manage specialist referrals. The specialist's office should contact you within 2 weeks to schedule an appointment. Call us  if you haven't heard from them after 2 weeks.  Staying Connected  [x]   MyChart: Activate your MyChart for the fastest way to access results and message us . See the last page of this paperwork for instructions on how to activate.  Billing  [x]   X-ray & Lab Orders: These are billed by separate companies. Contact the invoicing company directly for  questions or concerns. [x]   Visit Charges: Discuss any billing inquiries with our administrative services team.  Your Satisfaction Matters  [x]   Share Your Experience: We strive for your satisfaction! If you have any complaints, or preferably compliments, please let Dr. Boston Byers know directly or contact our Practice Administrators, Olinda Bertrand or [employer], by asking at the front desk.   Health Maintenance, Female Adopting a healthy lifestyle and getting preventive care are important in promoting health and wellness. Ask your health care provider about: The right schedule for you to have regular tests and exams. Things you can do on your own to prevent diseases and keep yourself healthy. What should I know about diet, weight, and exercise? Eat a healthy diet  Eat a diet that includes plenty of vegetables, fruits, low-fat dairy products, and lean protein. Do not eat a lot of foods that are high in solid fats, added sugars, or sodium. Maintain a healthy weight Body mass index (BMI) is used to identify weight problems. It  estimates body fat based on height and weight. Your health care provider can help determine your BMI and help you achieve or maintain a healthy weight. Get regular exercise Get regular exercise. This is one of the most important things you can do for your health. Most adults should: Exercise for at least 150 minutes each week. The exercise should increase your heart rate and make you sweat (moderate-intensity exercise). Do strengthening exercises at least twice a week. This is in addition to the moderate-intensity exercise. Spend less time sitting. Even light physical activity can be beneficial. Watch cholesterol and blood lipids Have your blood tested for lipids and cholesterol at 50 years of age, then have this test every 5 years. Have your cholesterol levels checked more often if: Your lipid or cholesterol levels are high. You are older than 50 years of age. You are at high risk for heart disease. What should I know about cancer screening? Depending on your health history and family history, you may need to have cancer screening at various ages. This may include screening for: Breast cancer. Cervical cancer. Colorectal cancer. Skin cancer. Lung cancer. What should I know about heart disease, diabetes, and high blood pressure? Blood pressure and heart disease High blood pressure causes heart disease and increases the risk of stroke. This is more likely to develop in people who have high blood pressure readings or are overweight. Have your blood pressure checked: Every 3-5 years if you are 37-75 years of age. Every year if you are 99 years old or older. Diabetes Have regular diabetes screenings. This checks your fasting blood sugar level. Have the screening done: Once every three years after age 23 if you are at a normal weight and have a low risk for diabetes. More often and at a younger age if you are overweight or have a high risk for diabetes. What should I know about preventing  infection? Hepatitis B If you have a higher risk for hepatitis B, you should be screened for this virus. Talk with your health care provider to find out if you are at risk for hepatitis B infection. Hepatitis C Testing is recommended for: Everyone born from [birth year] through 1967. Anyone with known risk factors for hepatitis C. Sexually transmitted infections (STIs) Get screened for STIs, including gonorrhea and chlamydia, if: You are sexually active and are younger than 50 years of age. You are older than 50 years of age and your health care provider tells you that you are  at risk for this type of infection. Your sexual activity has changed since you were last screened, and you are at increased risk for chlamydia or gonorrhea. Ask your health care provider if you are at risk. Ask your health care provider about whether you are at high risk for HIV. Your health care provider may recommend a prescription medicine to help prevent HIV infection. If you choose to take medicine to prevent HIV, you should first get tested for HIV. You should then be tested every 3 months for as long as you are taking the medicine. Pregnancy If you are about to stop having your period (premenopausal) and you may become pregnant, seek counseling before you get pregnant. Take 400 to 800 micrograms (mcg) of folic acid every day if you become pregnant. Ask for birth control (contraception) if you want to prevent pregnancy. Osteoporosis and menopause Osteoporosis is a disease in which the bones lose minerals and strength with aging. This can result in bone fractures. If you are 21 years old or older, or if you are at risk for osteoporosis and fractures, ask your health care provider if you should: Be screened for bone loss. Take a calcium or vitamin D supplement to lower your risk of fractures. Be given hormone replacement therapy (HRT) to treat symptoms of menopause. Follow these instructions at home: Alcohol use Do not  drink alcohol if: Your health care provider tells you not to drink. You are pregnant, may be pregnant, or are planning to become pregnant. If you drink alcohol: Limit how much you have to: 0-1 drink a day. Know how much alcohol is in your drink. In the [place] one drink equals one 12 oz bottle of beer (355 mL), one 5 oz glass of wine (148 mL), or one 1 oz glass of hard liquor (44 mL). Lifestyle Do not use any products that contain nicotine or tobacco. These products include cigarettes, chewing tobacco, and vaping devices, such as e-cigarettes. If you need help quitting, ask your health care provider. Do not use street drugs. Do not share needles. Ask your health care provider for help if you need support or information about quitting drugs. General instructions Schedule regular health, dental, and eye exams. Stay current with your vaccines. Tell your health care provider if: You often feel depressed. You have ever been abused or do not feel safe at home. Summary Adopting a healthy lifestyle and getting preventive care are important in promoting health and wellness. Follow your health care provider's instructions about healthy diet, exercising, and getting tested or screened for diseases. Follow your health care provider's instructions on monitoring your cholesterol and blood pressure. This information is not intended to replace advice given to you by your health care provider. Make sure you discuss any questions you have with your health care provider. Document Revised: 02/21/2021 Document Reviewed: 02/21/2021 Elsevier Patient Education  2024 Elsevier Inc. Mantenimiento de la salud en las mujeres Health Maintenance, Female Adoptar un estilo de vida saludable y recibir atencin preventiva son importantes para promover la salud y Counsellor. Consulte al mdico sobre: El esquema adecuado para hacerse pruebas y exmenes peridicos. Cosas que puede hacer por su cuenta para prevenir  enfermedades y [place] sano. Qu debo saber sobre la dieta, el peso y el ejercicio? Consuma una dieta saludable  Consuma una dieta que incluya muchas verduras, frutas, productos lcteos con bajo contenido de grasa y protenas magras. No consuma muchos alimentos ricos en grasas slidas, azcares agregados o sodio. Mantenga un peso saludable  El ndice de masa muscular [facility]) se [place] para identificar problemas de peso. Proporciona una estimacin de la grasa corporal basndose en el peso y la altura. Su mdico puede ayudarle a determinar su IMC y a Personnel officer o Pharmacologist un peso saludable. Haga ejercicio con regularidad Haga ejercicio con regularidad. Esta es una de las prcticas ms importantes que puede hacer por su salud. La [employer] de los adultos deben seguir estas pautas: Education officer, environmental, al menos, 150 minutos de actividad fsica por semana. El ejercicio debe aumentar la frecuencia cardaca y Media planner transpirar (ejercicio de intensidad moderada). Hacer ejercicios de fortalecimiento por lo [employer] por semana. Agregue esto a su plan de ejercicio de intensidad moderada. Pase menos tiempo sentada. Incluso la actividad fsica ligera puede ser beneficiosa. Controle sus niveles de colesterol y lpidos en la sangre Comience a realizarse anlisis de lpidos y Oncologist en la sangre a los 20 aos y luego reptalos cada 5 aos. Hgase controlar los niveles de colesterol con mayor frecuencia si: Sus niveles de lpidos y colesterol son altos. Es mayor de 40 aos. Presenta un alto riesgo de padecer enfermedades cardacas. Qu debo saber sobre las pruebas de deteccin del cncer? Segn su historia clnica y sus antecedentes familiares, es posible que deba realizarse pruebas de deteccin del cncer en diferentes edades. Esto puede incluir pruebas de deteccin de lo siguiente: Cncer de mama. Cncer de cuello uterino. Cncer colorrectal. Cncer de piel. Cncer de pulmn. Qu debo saber sobre la  enfermedad cardaca, la diabetes y la hipertensin arterial? Presin arterial y enfermedad cardaca La hipertensin arterial causa enfermedades cardacas y [place] el riesgo de accidente cerebrovascular. Es ms probable que esto se manifieste en las personas que tienen lecturas de presin arterial alta o tienen sobrepeso. Hgase controlar la presin arterial: Cada 3 a 5 aos si tiene entre 18 y 46 aos. Todos los aos si es mayor de 40 aos. Diabetes Realcese exmenes de deteccin de la diabetes con regularidad. Este anlisis revisa el nivel de azcar en la sangre en [place]. Hgase las pruebas de deteccin: Cada tres aos despus de los 40 aos de edad si tiene un peso normal y un bajo riesgo de padecer diabetes. Con ms frecuencia y a partir de [place] edad inferior si tiene sobrepeso o un alto riesgo de padecer diabetes. Qu debo saber sobre la prevencin de infecciones? Hepatitis B Si tiene un riesgo ms alto de contraer hepatitis B, debe someterse a un examen de deteccin de este virus. Hable con el mdico para averiguar si tiene riesgo de contraer la infeccin por hepatitis B. Hepatitis C Se recomienda el anlisis a: [employer] 1945 y 1965. Todas las personas que tengan un riesgo de haber contrado hepatitis C. Enfermedades de transmisin sexual (ETS) Hgase las pruebas de Airline pilot de ITS, incluidas la gonorrea y la clamidia, si: Es sexualmente activa y es menor de [address]. Es mayor de [address], y Public affairs consultant informa que corre riesgo de tener este tipo de infecciones. La actividad sexual ha cambiado desde que le hicieron la ltima prueba de deteccin y tiene un riesgo mayor de tener clamidia o Copy. Pregntele al mdico si usted tiene riesgo. Pregntele al mdico si usted tiene un alto riesgo de Primary school teacher VIH. El mdico tambin puede recomendarle un medicamento recetado para ayudar a evitar la infeccin por el VIH. Si elige tomar medicamentos para prevenir el VIH,  primero debe [employer] de deteccin del VIH. Luego debe hacerse anlisis cada 3 meses  mientras est tomando los medicamentos. Embarazo Si est por dejar de menstruar (fase premenopusica) y usted puede quedar embarazada, busque asesoramiento antes de quedar embarazada. Tome de 400 a 800 microgramos (mcg) de cido flico todos los das si queda embarazada. Pida mtodos de control de la natalidad (anticonceptivos) si desea evitar un embarazo no deseado. Osteoporosis y [place] La osteoporosis es una enfermedad en la que los huesos pierden los minerales y la fuerza por el avance de la edad. El resultado pueden ser fracturas en los [place]. Si tiene 65 aos o ms, o si est en riesgo de sufrir osteoporosis y fracturas, pregunte a su mdico si debe: Hacerse pruebas de deteccin de prdida sea. Tomar un suplemento de calcio o de vitamina D para reducir el riesgo de fracturas. Recibir terapia de reemplazo hormonal (TRH) para tratar los sntomas de la menopausia. Siga estas indicaciones en su casa: Consumo de alcohol No beba alcohol si: Su mdico le indica no hacerlo. Est embarazada, puede estar embarazada o est tratando de quedar embarazada. Si bebe alcohol: Limite la cantidad que bebe a lo siguiente: De 0 a 1 bebida por da. Sepa cunta cantidad de alcohol hay en las bebidas que toma. En los [address], una medida equivale a una botella de cerveza de 12 oz (355 ml), un vaso de vino de 5 oz (148 ml) o un vaso de una bebida alcohlica de alta graduacin de 1 oz (44 ml). Estilo de vida No consuma ningn producto que contenga nicotina o tabaco. Estos productos incluyen cigarrillos, tabaco para Theatre manager y aparatos de vapeo, como los cigarrillos electrnicos. Si necesita ayuda para dejar de consumir estos productos, consulte al mdico. No consuma drogas. No comparta agujas. Solicite ayuda a su mdico si necesita apoyo o informacin para abandonar las drogas. Indicaciones  generales Realcese los estudios de rutina de [address], dentales y de Wellsite geologist. Mantngase al da con las vacunas. Infrmele a su mdico si: Se siente deprimida con frecuencia. Alguna vez ha sido vctima de maltrato o no se siente seguro en su casa. Resumen Adoptar un estilo de vida saludable y recibir atencin preventiva son importantes para promover la salud y Counsellor. Siga las instrucciones del mdico acerca de una dieta saludable, el ejercicio y la realizacin de pruebas o exmenes para Hotel manager. Siga las instrucciones del mdico con respecto al control del colesterol y la presin arterial. Esta informacin no tiene Theme park manager el consejo del mdico. Asegrese de hacerle al mdico cualquier pregunta que tenga. Document Revised: 03/10/2021 Document Reviewed: 03/10/2021 [employer] Patient Education  2024 [employer]    Why follow it? Research shows. Those who follow the Mediterranean diet have a reduced risk of heart disease  The diet is associated with a reduced incidence of Parkinson's and Alzheimer's diseases People following the diet may have longer life expectancies and lower rates of chronic diseases  The Dietary Guidelines for Americans recommends the Mediterranean diet as an eating plan to promote health and prevent disease  What Is the Mediterranean Diet?  Healthy eating plan based on typical foods and recipes of Mediterranean-style cooking The diet is primarily a plant based diet; these foods should make up a majority of meals   Starches - Plant based foods should make up a majority of meals - They are an important sources of vitamins, minerals, energy, antioxidants, and fiber - Choose whole grains, foods high in fiber and minimally processed items  - Typical grain sources include wheat, oats, barley, corn, brown rice, bulgar,  farro, millet, polenta, couscous  - Various types of beans include chickpeas, lentils, fava beans, black beans, white beans    Fruits  Veggies - Large quantities of antioxidant rich fruits & veggies; 6 or more servings  - Vegetables can be eaten raw or lightly drizzled with oil and cooked  - Vegetables common to the traditional Mediterranean Diet include: artichokes, arugula, beets, broccoli, brussel sprouts, cabbage, carrots, celery, collard greens, cucumbers, eggplant, kale, leeks, lemons, lettuce, mushrooms, okra, onions, peas, peppers, potatoes, pumpkin, radishes, rutabaga, shallots, spinach, sweet potatoes, turnips, zucchini - Fruits common to the Mediterranean Diet include: apples, apricots, avocados, cherries, clementines, dates, figs, grapefruits, grapes, melons, nectarines, oranges, peaches, pears, pomegranates, strawberries, tangerines  Fats - Replace butter and margarine with healthy oils, such as olive oil, canola oil, and tahini  - Limit nuts to no more than a handful a day  - Nuts include walnuts, almonds, pecans, pistachios, pine nuts  - Limit or avoid candied, honey roasted or heavily salted nuts - Olives are central to the [employer] - can be eaten whole or used in a variety of dishes   Meats Protein - Limiting red meat: no more than a few times a month - When eating red meat: choose lean cuts and keep the portion to the size of deck of cards - Eggs: approx. 0 to 4 times a week  - Fish and lean poultry: at least 2 a week  - Healthy protein sources include, chicken, [place], lean beef, lamb - Increase intake of seafood such as tuna, salmon, trout, mackerel, shrimp, scallops - Avoid or limit high fat processed meats such as sausage and bacon  Dairy - Include moderate amounts of low fat dairy products  - Focus on healthy dairy such as fat free yogurt, skim milk, low or reduced fat cheese - Limit dairy products higher in fat such as whole or 2% milk, cheese, ice cream  Alcohol - Moderate amounts of red wine is ok  - No more than 5 oz daily for women (all ages) and men older than age 12  - No more  than 10 oz of wine daily for men younger than 17  Other - Limit sweets and other desserts  - Use herbs and spices instead of salt to flavor foods  - Herbs and spices common to the traditional Mediterranean Diet include: basil, bay leaves, chives, cloves, cumin, fennel, garlic, lavender, marjoram, mint, oregano, parsley, pepper, rosemary, sage, savory, sumac, tarragon, thyme   It's not just a diet, it's a lifestyle:  The Mediterranean diet includes lifestyle factors typical of those in the region  Foods, drinks and meals are best eaten with others and savored Daily physical activity is important for overall good health This could be strenuous exercise like running and aerobics This could also be more leisurely activities such as walking, housework, yard-work, or taking the stairs Moderation is the key; a balanced and healthy diet accommodates most foods and drinks Consider portion sizes and frequency of consumption of certain foods   Meal Ideas & Options:  Breakfast:  Whole wheat toast or whole wheat English muffins with peanut butter & hard boiled egg Steel cut oats topped with apples & cinnamon and skim milk  Fresh fruit: banana, strawberries, melon, berries, peaches  Smoothies: strawberries, bananas, greek yogurt, peanut butter Low fat greek yogurt with blueberries and granola  Egg white omelet with spinach and mushrooms Breakfast couscous: whole wheat couscous, apricots, skim milk, cranberries  Sandwiches:  Hummus and grilled vegetables (  peppers, zucchini, squash) on whole wheat bread   Grilled chicken on whole wheat pita with lettuce, tomatoes, cucumbers or tzatziki  [place] salad on whole wheat bread: tuna salad made with greek yogurt, olives, red peppers, capers, green onions Garlic rosemary lamb pita: lamb sauted with garlic, rosemary, salt & pepper; add lettuce, cucumber, greek yogurt to pita - flavor with lemon juice and black pepper  Seafood:  Mediterranean grilled salmon,  seasoned with garlic, basil, parsley, lemon juice and black pepper Shrimp, lemon, and spinach whole-grain pasta salad made with low fat greek yogurt  Seared scallops with lemon orzo  Seared tuna steaks seasoned salt, pepper, coriander topped with tomato mixture of olives, tomatoes, olive oil, minced garlic, parsley, green onions and cappers  Meats:  Herbed greek chicken salad with kalamata olives, cucumber, feta  Red bell peppers stuffed with spinach, bulgur, lean ground beef (or lentils) & topped with feta   Kebabs: skewers of chicken, tomatoes, onions, zucchini, squash  [place] burgers: made with red onions, mint, dill, lemon juice, feta cheese topped with roasted red peppers Vegetarian Cucumber salad: cucumbers, artichoke hearts, celery, red onion, feta cheese, tossed in olive oil & lemon juice  Hummus and whole grain pita points with a greek salad (lettuce, tomato, feta, olives, cucumbers, red onion) Lentil soup with celery, carrots made with vegetable broth, garlic, salt and pepper  Tabouli salad: parsley, bulgur, mint, scallions, cucumbers, tomato, radishes, lemon juice, olive oil, salt and pepper.      American Heart Association (AHA) Exercise Recommendation  Being physically active is important to prevent heart disease and stroke, the nation's No. 1and No. 5killers. To improve overall cardiovascular health, we suggest at least 150 minutes per week of moderate exercise or 75 minutes per week of vigorous exercise (or a combination of moderate and vigorous activity). Thirty minutes a day, five times a week is an easy goal to remember. You will also experience benefits even if you divide your time into two or three segments of 10 to 15 minutes per day.  For people who would benefit from lowering their blood pressure or cholesterol, we recommend 40 minutes of aerobic exercise of moderate to vigorous intensity three to four times a week to lower the risk for heart attack and stroke.  Physical  activity is anything that makes you move your body and burn calories.  This includes things like climbing stairs or playing sports. Aerobic exercises benefit your heart, and include walking, jogging, swimming or biking. Strength and stretching exercises are best for overall stamina and flexibility.  The simplest, positive change you can make to effectively improve your heart health is to start walking. It's enjoyable, free, easy, social and great exercise. A walking program is flexible and boasts high success rates because people can stick with it. It's easy for walking to become a regular and satisfying part of life.   For Overall Cardiovascular Health: At least 30 minutes of moderate-intensity aerobic activity at least 5 days per week for a total of 150  OR  At least 25 minutes of vigorous aerobic activity at least 3 days per week for a total of 75 minutes; or a combination of moderate- and vigorous-intensity aerobic activity  AND  Moderate- to high-intensity muscle-strengthening activity at least 2 days per week for additional health benefits.  For Lowering Blood Pressure and Cholesterol An average 40 minutes of moderate- to vigorous-intensity aerobic activity 3 or 4 times per week  What if I can't make it to the time goal?  Something is always better than nothing! And everyone has to start somewhere. Even if you've been sedentary for years, today is the day you can begin to make healthy changes in your life. If you don't think you'll make it for 30 or 40 minutes, set a reachable goal for today. You can work up toward your overall goal by increasing your time as you get stronger. Don't let all-or-nothing thinking rob you of doing what you can every day.  Source:http://www.heart.org

## 2024-04-04 LAB — URINALYSIS W MICROSCOPIC + REFLEX CULTURE
Bacteria, UA: NONE SEEN /HPF
Bilirubin Urine: NEGATIVE
Glucose, UA: NEGATIVE
Hgb urine dipstick: NEGATIVE
Hyaline Cast: NONE SEEN /LPF
Ketones, ur: NEGATIVE
Leukocyte Esterase: NEGATIVE
Nitrites, Initial: NEGATIVE
Protein, ur: NEGATIVE
RBC / HPF: NONE SEEN /HPF (ref 0–2)
Specific Gravity, Urine: 1.008 (ref 1.001–1.035)
WBC, UA: NONE SEEN /HPF (ref 0–5)
pH: 6 (ref 5.0–8.0)

## 2024-04-04 LAB — NO CULTURE INDICATED

## 2024-04-05 ENCOUNTER — Encounter: Payer: Self-pay | Admitting: Internal Medicine

## 2024-04-05 LAB — TSH RFX ON ABNORMAL TO FREE T4: TSH: 0.106 u[IU]/mL — ABNORMAL LOW (ref 0.450–4.500)

## 2024-04-05 LAB — T4F: T4,Free (Direct): 1.83 ng/dL — ABNORMAL HIGH (ref 0.82–1.77)

## 2024-04-06 MED ORDER — LEVOTHYROXINE SODIUM 100 MCG PO TABS
100.0000 ug | ORAL_TABLET | Freq: Every day | ORAL | 3 refills | Status: DC
Start: 2024-04-06 — End: 2024-07-18

## 2024-04-06 MED ORDER — CYCLOBENZAPRINE HCL 10 MG PO TABS
5.0000 mg | ORAL_TABLET | Freq: Three times a day (TID) | ORAL | 3 refills | Status: DC | PRN
Start: 2024-04-06 — End: 2024-08-24

## 2024-04-06 MED ORDER — NAPROXEN 500 MG PO TABS
500.0000 mg | ORAL_TABLET | Freq: Two times a day (BID) | ORAL | 3 refills | Status: AC
Start: 2024-04-06 — End: ?

## 2024-04-06 MED ORDER — PANTOPRAZOLE SODIUM 40 MG PO TBEC
40.0000 mg | DELAYED_RELEASE_TABLET | Freq: Every day | ORAL | 3 refills | Status: AC
Start: 2024-04-06 — End: ?

## 2024-05-07 ENCOUNTER — Telehealth: Admitting: Physician Assistant

## 2024-05-07 DIAGNOSIS — R3989 Other symptoms and signs involving the genitourinary system: Secondary | ICD-10-CM | POA: Diagnosis not present

## 2024-05-08 MED ORDER — CEPHALEXIN 500 MG PO CAPS: 500.0000 mg | ORAL_CAPSULE | Freq: Two times a day (BID) | ORAL | 0 refills | Status: AC

## 2024-05-08 NOTE — Progress Notes (Signed)

## 2024-05-08 NOTE — Progress Notes (Signed)
 I have spent 5 minutes in review of e-visit questionnaire, review and updating patient chart, medical decision making and response to patient.   Piedad Climes, PA-C

## 2024-05-12 ENCOUNTER — Telehealth: Payer: Self-pay

## 2024-05-12 NOTE — Telephone Encounter (Signed)
 Sent over labs to [facility]  fax [phone]

## 2024-06-24 ENCOUNTER — Encounter: Payer: Self-pay | Admitting: Internal Medicine

## 2024-06-24 DIAGNOSIS — F439 Reaction to severe stress, unspecified: Secondary | ICD-10-CM

## 2024-06-24 DIAGNOSIS — M545 Low back pain, unspecified: Secondary | ICD-10-CM

## 2024-06-27 MED ORDER — SERTRALINE HCL 50 MG PO TABS
50.0000 mg | ORAL_TABLET | Freq: Every day | ORAL | 3 refills | Status: DC
Start: 2024-06-27 — End: 2024-07-18

## 2024-07-07 LAB — HM MAMMOGRAPHY

## 2024-07-10 ENCOUNTER — Ambulatory Visit: Payer: Self-pay | Admitting: Internal Medicine

## 2024-07-18 MED ORDER — SERTRALINE HCL 50 MG PO TABS
50.0000 mg | ORAL_TABLET | Freq: Every day | ORAL | 4 refills | Status: AC
Start: 2024-07-18 — End: ?

## 2024-07-18 MED ORDER — LEVOTHYROXINE SODIUM 100 MCG PO TABS: 100.0000 ug | ORAL_TABLET | Freq: Every day | ORAL | 3 refills | Status: AC

## 2024-07-18 NOTE — Addendum Note (Signed)
 Addended by: Altheia Shafran G on: 07/18/2024 08:32 PM   Modules accepted: Orders

## 2024-08-24 ENCOUNTER — Other Ambulatory Visit: Payer: Self-pay | Admitting: Internal Medicine

## 2024-08-24 DIAGNOSIS — M545 Low back pain, unspecified: Secondary | ICD-10-CM
